# Patient Record
Sex: Male | Born: 1960 | Race: White | Hispanic: No | Marital: Single | Smoking: Current every day smoker
Health system: Southern US, Community
[De-identification: ages and names within clinical notes are randomized; demographics above are authoritative.]

## PROBLEM LIST (undated history)

## (undated) DIAGNOSIS — K5792 Diverticulitis of intestine, part unspecified, without perforation or abscess without bleeding: Secondary | ICD-10-CM

## (undated) DIAGNOSIS — E785 Hyperlipidemia, unspecified: Secondary | ICD-10-CM

## (undated) DIAGNOSIS — I10 Essential (primary) hypertension: Secondary | ICD-10-CM

## (undated) DIAGNOSIS — K922 Gastrointestinal hemorrhage, unspecified: Secondary | ICD-10-CM

## (undated) HISTORY — PX: APPENDECTOMY: SHX54

## (undated) HISTORY — PX: TONSILLECTOMY: SUR1361

## (undated) HISTORY — DX: Gastrointestinal hemorrhage, unspecified: K92.2

## (undated) HISTORY — PX: COLON SURGERY: SHX602

---

## 2016-02-11 LAB — GLUCOSE, POCT (MANUAL RESULT ENTRY): POC Glucose: 109 mg/dl — AB (ref 70–99)

## 2016-02-12 DIAGNOSIS — Z79899 Other long term (current) drug therapy: Secondary | ICD-10-CM

## 2016-02-12 NOTE — Congregational Nurse Program (Signed)
Congregational Nurse Program Note  Date of Encounter: 02/12/2016  Past Medical History: No past medical history on file.  Encounter Details:     CNP Questionnaire - 02/12/16 1121      Patient Demographics   Is this a new or existing patient? New   Patient is considered a/an Not Applicable   Race Caucasian/White     Patient Assistance   Location of Patient Assistance Not Applicable   Patient's financial/insurance status Low Income   Uninsured Patient Yes   Interventions Appt. has been completed;Assisted patient in making appt.   Patient referred to apply for the following financial assistance [employer]   Food insecurities addressed Not Technical brewerApplicable   Transportation assistance No   Assistance securing medications Yes   Type of Designer, television/film setAssistance Other   Educational health offerings Behavioral health;Hypertension;Chronic disease;Medications;Navigating the healthcare system     Encounter Details   Primary purpose of visit Chronic Illness/Condition Visit;Navigating the Healthcare System   Was an Emergency Department visit averted? Yes   Does patient have a medical provider? No   Patient referred to Clinic   Was a mental health screening completed? (GAINS tool) No   Does patient have dental issues? No   Does your patient have an abnormal blood pressure today? Yes   Since previous encounter, have you referred patient for abnormal blood pressure that resulted in a new diagnosis or medication change? No   Does your patient have an abnormal blood glucose today? No   Since previous encounter, have you referred patient for abnormal blood glucose that resulted in a new diagnosis or medication change? No   Was there a life-saving intervention made? No     [facility] last night with a B/P of 180/104.  [facility] follow up with Kyle AbrahamsMary Ann Placey Olsen.  Client does not have a HCP, nor resources for medications.  Has been prescribed  anti-hypertensives but cannot afford them.  Appointment made with Ms. Kyle Olsen and visit completed.

## 2016-06-15 ENCOUNTER — Emergency Department
Admission: EM | Admit: 2016-06-15 | Discharge: 2016-06-15 | Disposition: A | Discharge status: Home / Self Care | Payer: Self-pay | Attending: Emergency Medicine | Admitting: Emergency Medicine

## 2016-06-15 ENCOUNTER — Encounter: Payer: Self-pay | Admitting: Emergency Medicine

## 2016-06-15 DIAGNOSIS — Z79899 Other long term (current) drug therapy: Secondary | ICD-10-CM | POA: Insufficient documentation

## 2016-06-15 DIAGNOSIS — F17210 Nicotine dependence, cigarettes, uncomplicated: Secondary | ICD-10-CM | POA: Insufficient documentation

## 2016-06-15 DIAGNOSIS — I10 Essential (primary) hypertension: Secondary | ICD-10-CM | POA: Insufficient documentation

## 2016-06-15 DIAGNOSIS — M5441 Lumbago with sciatica, right side: Secondary | ICD-10-CM | POA: Insufficient documentation

## 2016-06-15 HISTORY — DX: Essential (primary) hypertension: I10

## 2016-06-15 HISTORY — DX: Diverticulitis of intestine, part unspecified, without perforation or abscess without bleeding: K57.92

## 2016-06-15 MED ORDER — KETOROLAC TROMETHAMINE 60 MG/2ML IM SOLN
30.0000 mg | Freq: Once | INTRAMUSCULAR | Status: AC
  Administered 2016-06-15: 30 mg via INTRAMUSCULAR
  Filled 2016-06-15: qty 2

## 2016-06-15 MED ORDER — KETOROLAC TROMETHAMINE 10 MG PO TABS: 10.0000 mg | ORAL_TABLET | Freq: Three times a day (TID) | ORAL | 0 refills | Status: DC

## 2016-06-15 MED ORDER — ORPHENADRINE CITRATE 30 MG/ML IJ SOLN
60.0000 mg | INTRAMUSCULAR | Status: AC
  Administered 2016-06-15: 60 mg via INTRAMUSCULAR
  Filled 2016-06-15: qty 2

## 2016-06-15 MED ORDER — CYCLOBENZAPRINE HCL 5 MG PO TABS: 5.0000 mg | ORAL_TABLET | Freq: Three times a day (TID) | ORAL | 0 refills | Status: DC | PRN

## 2016-06-15 NOTE — ED Notes (Signed)
See triage note. States he twisted wrong this weekend   Developed pain to right hip/back which radiates into right leg   Ambulates with slight limp d/t pain

## 2016-06-15 NOTE — ED Triage Notes (Signed)
R hip pain x 2 days, began while working on roof.

## 2016-06-15 NOTE — Discharge Instructions (Signed)
You appear to have some back pain with mild sciatic nerve irritation. Take the prescription meds as directed. Apply moist heat and ice to reduce pain and swelling. Follow-up with your primary care provider or return to the ED for worsening symptoms.
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
## 2016-07-04 ENCOUNTER — Emergency Department: Payer: Self-pay

## 2016-07-04 ENCOUNTER — Encounter: Payer: Self-pay | Admitting: Emergency Medicine

## 2016-07-04 ENCOUNTER — Emergency Department
Admission: EM | Admit: 2016-07-04 | Discharge: 2016-07-04 | Disposition: A | Discharge status: Home / Self Care | Payer: Self-pay | Attending: Emergency Medicine | Admitting: Emergency Medicine

## 2016-07-04 DIAGNOSIS — F17210 Nicotine dependence, cigarettes, uncomplicated: Secondary | ICD-10-CM | POA: Insufficient documentation

## 2016-07-04 DIAGNOSIS — Z791 Long term (current) use of non-steroidal anti-inflammatories (NSAID): Secondary | ICD-10-CM | POA: Insufficient documentation

## 2016-07-04 DIAGNOSIS — Z79899 Other long term (current) drug therapy: Secondary | ICD-10-CM | POA: Insufficient documentation

## 2016-07-04 DIAGNOSIS — I160 Hypertensive urgency: Secondary | ICD-10-CM | POA: Insufficient documentation

## 2016-07-04 LAB — CBC
HEMATOCRIT: 48.9 % (ref 40.0–52.0)
HEMOGLOBIN: 16.6 g/dL (ref 13.0–18.0)
MCH: 29 pg (ref 26.0–34.0)
MCHC: 34 g/dL (ref 32.0–36.0)
MCV: 85.4 fL (ref 80.0–100.0)
Platelets: 227 10*3/uL (ref 150–440)
RBC: 5.73 MIL/uL (ref 4.40–5.90)
RDW: 14.8 % — ABNORMAL HIGH (ref 11.5–14.5)
WBC: 9.3 10*3/uL (ref 3.8–10.6)

## 2016-07-04 LAB — BASIC METABOLIC PANEL
ANION GAP: 7 (ref 5–15)
BUN: 18 mg/dL (ref 6–20)
CHLORIDE: 102 mmol/L (ref 101–111)
CO2: 27 mmol/L (ref 22–32)
Calcium: 9.5 mg/dL (ref 8.9–10.3)
Creatinine, Ser: 1.02 mg/dL (ref 0.61–1.24)
GFR calc Af Amer: >60 mL/min (ref >60)
GLUCOSE: 90 mg/dL (ref 65–99)
POTASSIUM: 4.4 mmol/L (ref 3.5–5.1)
Sodium: 136 mmol/L (ref 135–145)

## 2016-07-04 LAB — TROPONIN I: Troponin I: <0.03 ng/mL (ref <0.03)

## 2016-07-04 MED ORDER — CLONIDINE HCL 0.2 MG PO TABS: 0.2000 mg | ORAL_TABLET | Freq: Three times a day (TID) | ORAL | 0 refills | Status: DC | PRN

## 2016-07-04 MED ORDER — CLONIDINE HCL 0.1 MG PO TABS
0.2000 mg | ORAL_TABLET | Freq: Once | ORAL | Status: AC
  Administered 2016-07-04: 0.2 mg via ORAL
  Filled 2016-07-04: qty 2

## 2016-07-04 NOTE — ED Provider Notes (Signed)
Time Seen: Approximately 1828  I have reviewed the triage notes  Chief Complaint: Hypertension and Headache   History of Present Illness: Kyle Olsen is a 56 y.o. male who states he has a history of hypertension and has been taking his Lopressor 50 mg twice a day. He's been following his blood pressure over the recent couple of days due to some intermittent headaches and points mainly to the occipital and frontal region. He denies any neck pain, photophobia, nausea or vomiting. He shows me a "a piece of paper that shows blood pressure is mainly ranging from 100 diastolic to 115 diastolic and 161-096 systolic. He states his headaches usually resolve with ibuprofen. He states he contacted his primary caretaker which is a Publishing rights manager and they have called in some additional blood pressure medication for him but he has not had a chance to go get it filled and can't do it until Monday. States some left-sided chest "" tightness"" and some mild pleuritic shortness of breath that lasted approximately 2 hours last night. Denies any associated nausea or vomiting with this particular pain and denies any neck or arm or jaw discomfort. He denies any chest discomfort at present states he only noticed it when he tried to lie down to go to sleep. Outside of smoking as no pulmonary emboli risk factors.   Past Medical History:  Diagnosis Date  . Diverticulitis   . Hypertension     There are no active problems to display for this patient.   Past Surgical History:  Procedure Laterality Date  . APPENDECTOMY    . TONSILLECTOMY      Past Surgical History:  Procedure Laterality Date  . APPENDECTOMY    . TONSILLECTOMY      Current Outpatient Rx  . Order #: [id number] Class: Print  . Order #: [id number] Class: Historical Med  . Order #: [id number] Class: Historical Med  . Order #: [id number] Class: Print  . Order #: [id number] Class: Historical Med    Allergies:  Patient has no known  allergies.  Family History: History reviewed. No pertinent family history.  Social History: Social History  Substance Use Topics  . Smoking status: Current Every Day Smoker    Packs/day: 1.00    Types: Cigarettes  . Smokeless tobacco: Never Used  . Alcohol use No     Review of Systems:   10 point review of systems was performed and was otherwise negative:  Constitutional: No fever Eyes: No visual disturbances ENT: No sore throat, ear pain Cardiac: No chest pain Respiratory: No shortness of breath, wheezing, or stridor Abdomen: No abdominal pain, no vomiting, No diarrhea Endocrine: No weight loss, No night sweats Extremities: No peripheral edema, cyanosis Skin: No rashes, easy bruising Neurologic: No focal weakness, trouble with speech or swollowing Urologic: No dysuria, Hematuria, or urinary frequency   Physical Exam:  ED Triage Vitals [07/04/16 1717]  Enc Vitals Group     BP (!) 191/105     Pulse Rate 68     Resp 20     Temp 97.6 F (36.4 C)     Temp Source Oral     SpO2 99 %     Weight 155 lb (70.3 kg)     Height 5\' 7"  (1.702 m)     Head Circumference      Peak Flow      Pain Score 7     Pain Loc      Pain Edu?      Excl. in GC?  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 Left pupil is reactive to light with no papilledema and normal conjunctiva  Nose/Throat: No nasal drainage, patent upper airway without erythema or exudate.  Neck: Supple, Full range of motion, No anterior adenopathy or palpable thyroid masses. No meningeal signs Lungs: Clear to ascultation without wheezes , rhonchi, or rales Heart: Regular rate, regular rhythm without murmurs , gallops , or rubs Abdomen: Soft, non tender without rebound, guarding , or rigidity; bowel sounds positive and symmetric in all 4 quadrants. No organomegaly .        Extremities: 2  plus symmetric pulses. No edema, clubbing or cyanosis Neurologic: normal ambulation, Motor symmetric without deficits, sensory intact Skin: warm, dry, no rashes   Labs:   All laboratory work was reviewed including any pertinent negatives or positives listed below:  Labs Reviewed  CBC - Abnormal; Notable for the following:       Result Value   RDW 14.8 (*)    All other components within normal limits  BASIC METABOLIC PANEL  TROPONIN I    EKG: * ED ECG REPORT I, Jennye MoccasinBrian S Tarl Cephas, the attending physician, personally viewed and interpreted this ECG.  Date: 07/04/2016 EKG Time: 1712 Rate: 62 Rhythm: normal sinus rhythm QRS Axis: normal Intervals: normal ST/T Wave abnormalities: normal Conduction Disturbances: none Narrative Interpretation: unremarkable Normal EKG   Radiology: "Dg Chest 2 View  Result Date: 07/04/2016 CLINICAL DATA:  Pt states he started having headaches yesterday and checking blood pressure at home. Pt states he has noticed an increase in shortness of breath and left side chest pain to left shoulder. Pt took ibuprofen 800mg  around 4pm. Smoker. Hx of HTN EXAM: CHEST  2 VIEW COMPARISON:  None. FINDINGS: The cardiac silhouette is normal in size and configuration. No mediastinal or hilar masses or evidence of adenopathy. There is linear opacity at the lung bases consistent with subsegmental atelectasis. The lungs are otherwise clear. No pleural effusion or pneumothorax. Skeletal structures are intact. IMPRESSION: No active cardiopulmonary disease. Electronically Signed   By: Amie Portlandavid  Ormond M.D.   On: 07/04/2016 18:07   Ct Head Wo Contrast  Result Date: 07/04/2016 CLINICAL DATA:  Headache and htn x 2 days EXAM: CT HEAD WITHOUT CONTRAST TECHNIQUE: Contiguous axial images were obtained from the base of the skull through the vertex without intravenous contrast. COMPARISON:  None. FINDINGS: Brain: No evidence of acute infarction, hemorrhage, hydrocephalus, extra-axial  collection or mass lesion/mass effect. Vascular: No hyperdense vessel or unexpected calcification. Skull: Normal. Negative for fracture or focal lesion. Sinuses/Orbits: Visualize globes and orbits are unremarkable. Visualized sinuses are clear. Clear mastoid air cells. Other: None. IMPRESSION: No acute intracranial abnormalities. Electronically Signed   By: Amie Portlandavid  Ormond M.D.   On: 07/04/2016 20:31  "  I personally reviewed the radiologic studies    ED Course:  Patient's stay here was uneventful and the patient had a decrease in his blood pressure with the clonidine and feels improved at this point. I felt his headaches were unlikely to be a subarachnoid hemorrhage or aneurysmal disease. His chest pain which occurred over 2 hour span last night does not reflect any EKG changes or abnormalities and his troponin and did not seem to be obvious acute coronary syndrome at this time. He was advised that he should follow up with his primary physician for both complaints is high blood  pressure and also the left-sided chest discomfort. Clonidine for tomorrow and states he has a prescription waiting for him for additional blood pressure medication to start Monday. He was advised to return here if he has a repeat episode of chest pain or any other new concerns.     Assessment: * Hypertensive urgency      Plan:  Outpatient Aspirin per day " New Prescriptions   CLONIDINE (CATAPRES) 0.2 MG TABLET    Take 1 tablet (0.2 mg total) by mouth 3 (three) times daily as needed.  " Patient was advised to return immediately if condition worsens. Patient was advised to follow up with their primary care physician or other specialized physicians involved in their outpatient care. The patient and/or family member/power of attorney had laboratory results reviewed at the bedside. All questions and concerns were addressed and appropriate discharge instructions were distributed by the nursing staff.             Jennye Moccasin, MD 07/04/16 2111

## 2016-07-04 NOTE — Discharge Instructions (Signed)
Please continue with over-the-counter pain medications. Return to emergency Department for persistent chest discomfort, vomiting, sweating, or any other new concerns. Please continue with your Lopressor twice a day and initiate her blood pressure medication on Monday after receiving a from the pharmacy. He can use the clonidine and take it 3 times a day for tomorrow along with your continued Lopressor for blood pressure control.  Please return immediately if condition worsens. Please contact her primary physician or the physician you were given for referral. If you have any specialist physicians involved in her treatment and plan please also contact them. Thank you for using [facility].

## 2016-07-04 NOTE — ED Triage Notes (Signed)
Pt states he started having headaches yesterday and checking blood pressure at home. Pt states he has noticed an increase in shortness of breath and left side chest pain to left shoulder.  Pt took ibuprofen 800mg  around 4pm.

## 2016-11-23 ENCOUNTER — Emergency Department: Payer: Self-pay

## 2016-11-23 ENCOUNTER — Encounter: Payer: Self-pay | Admitting: Emergency Medicine

## 2016-11-23 ENCOUNTER — Emergency Department
Admission: EM | Admit: 2016-11-23 | Discharge: 2016-11-23 | Disposition: A | Discharge status: Home / Self Care | Payer: Self-pay | Attending: Emergency Medicine | Admitting: Emergency Medicine

## 2016-11-23 DIAGNOSIS — R51 Headache: Secondary | ICD-10-CM

## 2016-11-23 DIAGNOSIS — Z79899 Other long term (current) drug therapy: Secondary | ICD-10-CM | POA: Insufficient documentation

## 2016-11-23 DIAGNOSIS — R0602 Shortness of breath: Secondary | ICD-10-CM | POA: Insufficient documentation

## 2016-11-23 DIAGNOSIS — I10 Essential (primary) hypertension: Secondary | ICD-10-CM | POA: Insufficient documentation

## 2016-11-23 DIAGNOSIS — R519 Headache, unspecified: Secondary | ICD-10-CM

## 2016-11-23 DIAGNOSIS — I160 Hypertensive urgency: Secondary | ICD-10-CM | POA: Insufficient documentation

## 2016-11-23 DIAGNOSIS — F17210 Nicotine dependence, cigarettes, uncomplicated: Secondary | ICD-10-CM | POA: Insufficient documentation

## 2016-11-23 DIAGNOSIS — R202 Paresthesia of skin: Secondary | ICD-10-CM | POA: Insufficient documentation

## 2016-11-23 DIAGNOSIS — R079 Chest pain, unspecified: Secondary | ICD-10-CM | POA: Insufficient documentation

## 2016-11-23 LAB — BASIC METABOLIC PANEL
ANION GAP: 10 (ref 5–15)
BUN: 13 mg/dL (ref 6–20)
CO2: 27 mmol/L (ref 22–32)
Calcium: 9.9 mg/dL (ref 8.9–10.3)
Chloride: 102 mmol/L (ref 101–111)
Creatinine, Ser: 0.98 mg/dL (ref 0.61–1.24)
GFR calc Af Amer: >60 mL/min (ref >60)
GLUCOSE: 109 mg/dL — AB (ref 65–99)
POTASSIUM: 4.4 mmol/L (ref 3.5–5.1)
Sodium: 139 mmol/L (ref 135–145)

## 2016-11-23 LAB — TROPONIN I: Troponin I: <0.03 ng/mL (ref <0.03)

## 2016-11-23 LAB — CBC
HEMATOCRIT: 51 % (ref 40.0–52.0)
HEMOGLOBIN: 17.6 g/dL (ref 13.0–18.0)
MCH: 29.5 pg (ref 26.0–34.0)
MCHC: 34.5 g/dL (ref 32.0–36.0)
MCV: 85.5 fL (ref 80.0–100.0)
Platelets: 234 10*3/uL (ref 150–440)
RBC: 5.97 MIL/uL — ABNORMAL HIGH (ref 4.40–5.90)
RDW: 14.7 % — ABNORMAL HIGH (ref 11.5–14.5)
WBC: 8.4 10*3/uL (ref 3.8–10.6)

## 2016-11-23 MED ORDER — ONDANSETRON HCL 4 MG/2ML IJ SOLN
4.0000 mg | Freq: Once | INTRAMUSCULAR | Status: AC
  Administered 2016-11-23: 4 mg via INTRAVENOUS
  Filled 2016-11-23: qty 2

## 2016-11-23 MED ORDER — ASPIRIN 81 MG PO CHEW
324.0000 mg | CHEWABLE_TABLET | Freq: Once | ORAL | Status: AC
  Administered 2016-11-23: 324 mg via ORAL
  Filled 2016-11-23: qty 4

## 2016-11-23 MED ORDER — IOPAMIDOL (ISOVUE-370) INJECTION 76%
75.0000 mL | Freq: Once | INTRAVENOUS | Status: AC | PRN
  Administered 2016-11-23: 75 mL via INTRAVENOUS

## 2016-11-23 MED ORDER — NITROGLYCERIN 0.4 MG SL SUBL
0.4000 mg | SUBLINGUAL_TABLET | SUBLINGUAL | Status: DC | PRN
  Administered 2016-11-23: 0.4 mg via SUBLINGUAL
  Filled 2016-11-23: qty 1

## 2016-11-23 MED ORDER — MORPHINE SULFATE (PF) 4 MG/ML IV SOLN
4.0000 mg | Freq: Once | INTRAVENOUS | Status: AC
  Administered 2016-11-23: 4 mg via INTRAVENOUS
  Filled 2016-11-23: qty 1

## 2016-11-23 NOTE — ED Notes (Signed)
AAOx3.  Skin warm and dry.  Return from CT scan.

## 2016-11-23 NOTE — ED Notes (Signed)
AAOx3.  Skin warm and dry.  Ambulates with easy and steady gait. NAD 

## 2016-11-23 NOTE — ED Provider Notes (Signed)
[facility] Emergency Department Provider Note  ____________________________________________  Time seen: Approximately 9:03 AM  I have reviewed the triage vital signs and the nursing notes.   HISTORY  Chief Complaint Hypertension and Shortness of Breath   HPI Kyle Olsen is a 56 y.o. male with a history of hypertension who presents for evaluation of a headache and chest pain.Patient reports that he woke up this morning with a severe headache that is located in the right side of his head, sharp, nonradiating and constant. Symptoms started around 4:30 AM. Just before to arrival to the emergency room patient started having heaviness in his chest that he describes as mild to moderate and numbness in his left arm. Patient denies personal or family history of heart attacks. He took his antihypertensives this morning including clonidine and metoprolol. Patient denies ever having similar headache or chest pain. He denies changes in vision, nausea or vomiting, slurred speech, facial droop, unilateral weakness or numbness. Patient is a smoker.  Past Medical History:  Diagnosis Date  . Diverticulitis   . Hypertension     There are no active problems to display for this patient.   Past Surgical History:  Procedure Laterality Date  . APPENDECTOMY    . TONSILLECTOMY      Prior to Admission medications   Medication Sig Start Date End Date Taking? Authorizing Provider  cloNIDine (CATAPRES) 0.2 MG tablet Take 1 tablet (0.2 mg total) by mouth 3 (three) times daily as needed. Patient taking differently: Take 0.1 mg by mouth 2 (two) times daily.  07/04/16 11/23/16 Yes Jennye MoccasinQuigley, Brian S, MD  cyclobenzaprine (FLEXERIL) 5 MG tablet Take 1 tablet (5 mg total) by mouth 3 (three) times daily as needed for muscle spasms. Patient taking differently: Take 10 mg by mouth daily.  06/15/16  Yes Menshew, Charlesetta IvoryJenise V Bacon, PA-C  gabapentin (NEURONTIN) 300 MG capsule Take 300 mg by mouth 2  (two) times daily.    Yes [provider]  metoprolol (LOPRESSOR) 50 MG tablet Take 50 mg by mouth 2 (two) times daily.   Yes [provider]  ketorolac (TORADOL) 10 MG tablet Take 1 tablet (10 mg total) by mouth every 8 (eight) hours. Patient not taking: Reported on 11/23/2016 06/15/16   Menshew, Charlesetta IvoryJenise V Bacon, PA-C    Allergies Patient has no known allergies.  Family History Mother: no lung cancer, stroke, or heart attack Father: no lung cancer, stroke, or heart attack Siblings: no lung cancer, stroke, or heart attack  Social History Social History  Substance Use Topics  . Smoking status: Current Every Day Smoker    Packs/day: 1.00    Types: Cigarettes  . Smokeless tobacco: Never Used  . Alcohol use No    Review of Systems  Constitutional: Negative for fever. Eyes: Negative for visual changes. ENT: Negative for sore throat. Neck: No neck pain  Cardiovascular: + chest pain. Respiratory: Negative for shortness of breath. Gastrointestinal: Negative for abdominal pain, vomiting or diarrhea. Genitourinary: Negative for dysuria. Musculoskeletal: Negative for back pain. Skin: Negative for rash. Neurological: Negative for weakness or numbness. + HA Psych: No SI or HI  ____________________________________________   PHYSICAL EXAM:  VITAL SIGNS: ED Triage Vitals  Enc Vitals Group     BP 11/23/16 0736 (!) 181/100     Pulse Rate 11/23/16 0736 72     Resp 11/23/16 0736 18     Temp 11/23/16 0736 98 F (36.7 C)     Temp Source 11/23/16 0736 Oral  SpO2 11/23/16 0736 100 %     Weight 11/23/16 0734 170 lb (77.1 kg)     Height 11/23/16 0734 5\' 7"  (1.702 m)     Head Circumference --      Peak Flow --      Pain Score 11/23/16 0733 10     Pain Loc --      Pain Edu? --      Excl. in GC? --     Constitutional: Alert and oriented, patient is distressed due to pain.  HEENT:      Head: Normocephalic and atraumatic.         Eyes: Conjunctivae are normal.  Sclera is non-icteric. Pupils are equal round and reactive, patient has disconjugate gaze which is chronic      Mouth/Throat: Mucous membranes are moist.       Neck: Supple with no signs of meningismus. Cardiovascular: Regular rate and rhythm. No murmurs, gallops, or rubs. 2+ symmetrical distal pulses are present in all extremities. No JVD. Respiratory: Normal respiratory effort. Lungs are clear to auscultation bilaterally. No wheezes, crackles, or rhonchi.  Gastrointestinal: Soft, non tender, and non distended with positive bowel sounds. No rebound or guarding. Musculoskeletal: Nontender with normal range of motion in all extremities. No edema, cyanosis, or erythema of extremities. Neurologic: Normal speech and language. A & O x3, PERRL, no nystagmus, CN II-XII intact, motor testing reveals good tone and bulk throughout. There is no evidence of pronator drift or dysmetria. Muscle strength is 5/5 throughout. Sensory examination is intact. Gait is normal. Skin: Skin is warm, dry and intact. No rash noted. Psychiatric: Mood and affect are normal. Speech and behavior are normal.  ____________________________________________   LABS (all labs ordered are listed, but only abnormal results are displayed)  Labs Reviewed  BASIC METABOLIC PANEL - Abnormal; Notable for the following:       Result Value   Glucose, Bld 109 (*)    All other components within normal limits  CBC - Abnormal; Notable for the following:    RBC 5.97 (*)    RDW 14.7 (*)    All other components within normal limits  TROPONIN I  TROPONIN I   ____________________________________________  EKG  ED ECG REPORT I, Nita Sickle, the attending physician, personally viewed and interpreted this ECG.  07:34 - Normal sinus rhythm, rate of 79, normal intervals, normal axis, no ST elevations or depressions.   09:22 - Normal sinus rhythm,Rate of 68, normal intervals, right axis deviation, T-wave inversions in 3 and aVF, no ST  elevation.  11:29 - normal sinus rhythm, rate of 56, normal intervals, right axis deviation, T-wave inversions in 3 and aVF, no ST elevation. ____________________________________________  RADIOLOGY  CXR: 1. No acute cardiopulmonary abnormality. 2. Stable bibasilar scarring or atelectasis. Possible upper lobe emphysema and scarring     CT head: Negative noncontrast head CT.    CT angio chest: 1. No thoracic aortic aneurysm or dissection. Areas of atherosclerotic calcification noted in the aorta and left subclavian artery. Scattered foci of coronary artery calcification noted. No evident pulmonary embolus.  2. Bullous disease in right upper lobe. Mild bibasilar lung scarring. No edema or consolidation.  3. Nodular lesions in right upper lobe and right middle lobe, largest measuring 5 mm. No follow-up needed if patient is low-risk (and has no known or suspected primary neoplasm). Non-contrast chest CT can be considered in 12 months if patient is high-risk. This recommendation follows the consensus statement: Guidelines for Management of Incidental Pulmonary  Nodules Detected on CT Images: From the Fleischner Society 2017; Radiology 2017; [id number].  4. No demonstrable adenopathy.  5. Cholelithiasis.  6. Incomplete visualization 3 mm calculus upper pole right kidney. ____________________________________________   PROCEDURES  Procedure(s) performed: None Procedures Critical Care performed:  None ____________________________________________   INITIAL IMPRESSION / ASSESSMENT AND PLAN / ED COURSE  56 y.o. male with a history of hypertension who presents for evaluation of a headache and chest pain.Patient's symptoms distressed due to his headache, patient is hypertensive with BP of 181/100, he is neurologically intact. Patient was sent stat head CT which was negative for bleed. Patient received 4 mg of IV morphine with full resolution of his headache. Remains well  appearing and neurologically intact. Patient's initial EKG with no ischemic changes. Patient will be given aspirin and nitroglycerin. We'll get a repeat EKG. We'll cycle cardiac enzymes. We'll monitor patient on telemetry for any signs of ischemia.    ----------------------------------------- 07:54 AM on 11/23/2016 -----------------------------------------   OBSERVATION CARE: This patient is being placed under observation care for the following reasons: Chest pain with repeat testing to rule out ischemia  ----------------------------------------- 09:09 AM on 11/23/2016 ----------------------------------------- Patient's HA resolved. CT head negative for bleed done within less than 6 hours of onset of HA. Patient remains neurologically intact with no concerns for stroke. Patient continues to complain of tightness in his chest radiating to his left arm. Repeat EKG showed new TWI in lead 3 but no STE. Patient received one sublingual nitroglycerin with resolution of the pain. Patient was given a full dose of aspirin. First troponin is negative. We'll continue to monitor her second troponin. CTA ordered to rule out dissection.  _________________________ ----------------------------------------- 12:08 PM on 11/23/2016 -----------------------------------------   END OF OBSERVATION STATUS: After an appropriate period of observation, this patient is being discharged due to the following reason(s):     Second troponin negative. Third EKG while pain free showing persistent TWI in 3 with no STE. Patient remains pain free. Will dc home with f/u with cardiology. Discussed with the patient the findings of 2 small nodules in his right lung and recommended follow-up with his primary care doctor in 12 months for repeat CT scan. Patient will make sure that he gets this follow-up. Also recommend he return to the emergency room if he develops new onset of chest pain or severe headache.     Pertinent labs &  imaging results that were available during my care of the patient were reviewed by me and considered in my medical decision making (see chart for details).    ____________________________________________   FINAL CLINICAL IMPRESSION(S) / ED DIAGNOSES  Final diagnoses:  Hypertensive urgency  Acute nonintractable headache, unspecified headache type  Chest pain, unspecified type      NEW MEDICATIONS STARTED DURING THIS VISIT:  New Prescriptions   No medications on file     Note:  This document was prepared using Dragon voice recognition software and may include unintentional dictation errors.    Nita Sickle, MD 11/23/16 [phone]

## 2016-11-23 NOTE — ED Notes (Signed)
To CT Scan via stretcher.

## 2016-11-23 NOTE — ED Triage Notes (Signed)
Pt reports elevated blood pressure, left arm numbness and severe headache. Pt reports symptoms began at approximately 0430 today.

## 2016-11-23 NOTE — Discharge Instructions (Signed)
You were seen for chest pain. Your workup today was reassuring. As I explained to you that does not mean that you do not have heart disease. You may need further evaluation to ensure you do not have a serious heart problem. Therefore it is imperative that you follow up with cardiology in 1-2 days for further evaluation.   Also, as I explained to you your CT showed two nodules in your right lung. You must repeat CT scan in 12 months to make sure these are stable and not concerning for early lung cancer. Your doctor can order the CT for you.  When should you call for help?  Call 911 if: You passed out (lost consciousness) or if you feel dizzy. You have difficulty breathing. You have symptoms of a heart attack. These may include: Chest pain or pressure, or a strange feeling in your chest. Indigestion. Sweating. Shortness of breath. Nausea or vomiting. Pain, pressure, or a strange feeling in your back, neck, jaw, or upper belly or in one or both shoulders or arms. Lightheadedness or sudden weakness. A fast or irregular heartbeat. After you call 911, the operator may tell you to chew 1 adult-strength or 2 to 4 low-dose aspirin. Wait for an ambulance. Do not try to drive yourself.   Call your doctor today if: You have any trouble breathing. Your chest pain gets worse. You are dizzy or lightheaded, or you feel like you may faint. You are not getting better as expected. You are having new or different chest pain  How can you care for yourself at home? Rest until you feel better. Take your medicine exactly as prescribed. Call your doctor if you think you are having a problem with your medicine. Do not drive after taking a prescription pain medicine.

## 2016-11-25 ENCOUNTER — Observation Stay
Admission: EM | Admit: 2016-11-25 | Discharge: 2016-11-26 | Disposition: A | Discharge status: Home / Self Care | Payer: Self-pay | Attending: Internal Medicine | Admitting: Internal Medicine

## 2016-11-25 ENCOUNTER — Encounter: Payer: Self-pay | Admitting: Internal Medicine

## 2016-11-25 ENCOUNTER — Emergency Department: Payer: Self-pay

## 2016-11-25 ENCOUNTER — Ambulatory Visit (INDEPENDENT_AMBULATORY_CARE_PROVIDER_SITE_OTHER): Payer: Self-pay | Admitting: Internal Medicine

## 2016-11-25 ENCOUNTER — Encounter: Payer: Self-pay | Admitting: Emergency Medicine

## 2016-11-25 ENCOUNTER — Ambulatory Visit: Payer: Self-pay | Admitting: Internal Medicine

## 2016-11-25 VITALS — BP 180/114 | HR 94 | Ht 67.0 in | Wt 175.0 lb

## 2016-11-25 DIAGNOSIS — Z7982 Long term (current) use of aspirin: Secondary | ICD-10-CM | POA: Insufficient documentation

## 2016-11-25 DIAGNOSIS — R079 Chest pain, unspecified: Secondary | ICD-10-CM | POA: Diagnosis present

## 2016-11-25 DIAGNOSIS — E785 Hyperlipidemia, unspecified: Secondary | ICD-10-CM | POA: Diagnosis present

## 2016-11-25 DIAGNOSIS — I700 Atherosclerosis of aorta: Secondary | ICD-10-CM | POA: Diagnosis present

## 2016-11-25 DIAGNOSIS — I200 Unstable angina: Secondary | ICD-10-CM

## 2016-11-25 DIAGNOSIS — I25110 Atherosclerotic heart disease of native coronary artery with unstable angina pectoris: Secondary | ICD-10-CM | POA: Insufficient documentation

## 2016-11-25 DIAGNOSIS — Z720 Tobacco use: Secondary | ICD-10-CM | POA: Insufficient documentation

## 2016-11-25 DIAGNOSIS — I161 Hypertensive emergency: Principal | ICD-10-CM | POA: Diagnosis present

## 2016-11-25 DIAGNOSIS — Z79899 Other long term (current) drug therapy: Secondary | ICD-10-CM | POA: Insufficient documentation

## 2016-11-25 DIAGNOSIS — F17210 Nicotine dependence, cigarettes, uncomplicated: Secondary | ICD-10-CM | POA: Insufficient documentation

## 2016-11-25 HISTORY — DX: Hyperlipidemia, unspecified: E78.5

## 2016-11-25 LAB — BASIC METABOLIC PANEL
Anion gap: 8 (ref 5–15)
BUN: 13 mg/dL (ref 6–20)
CALCIUM: 9.2 mg/dL (ref 8.9–10.3)
CO2: 25 mmol/L (ref 22–32)
Chloride: 106 mmol/L (ref 101–111)
Creatinine, Ser: 0.95 mg/dL (ref 0.61–1.24)
GFR calc Af Amer: >60 mL/min (ref >60)
GLUCOSE: 109 mg/dL — AB (ref 65–99)
Potassium: 3.6 mmol/L (ref 3.5–5.1)
Sodium: 139 mmol/L (ref 135–145)

## 2016-11-25 LAB — URINALYSIS, COMPLETE (UACMP) WITH MICROSCOPIC
BACTERIA UA: NONE SEEN
BILIRUBIN URINE: NEGATIVE
Glucose, UA: NEGATIVE mg/dL
Ketones, ur: NEGATIVE mg/dL
LEUKOCYTES UA: NEGATIVE
NITRITE: NEGATIVE
PH: 5 (ref 5.0–8.0)
Protein, ur: NEGATIVE mg/dL
SPECIFIC GRAVITY, URINE: 1.019 (ref 1.005–1.030)

## 2016-11-25 LAB — CBC WITH DIFFERENTIAL/PLATELET
Basophils Absolute: 0.1 10*3/uL (ref 0–0.1)
Basophils Relative: 1 %
EOS PCT: 3 %
Eosinophils Absolute: 0.3 10*3/uL (ref 0–0.7)
HCT: 48.5 % (ref 40.0–52.0)
Hemoglobin: 16.9 g/dL (ref 13.0–18.0)
LYMPHS ABS: 2.2 10*3/uL (ref 1.0–3.6)
LYMPHS PCT: 25 %
MCH: 29.5 pg (ref 26.0–34.0)
MCHC: 34.9 g/dL (ref 32.0–36.0)
MCV: 84.7 fL (ref 80.0–100.0)
MONO ABS: 0.6 10*3/uL (ref 0.2–1.0)
Monocytes Relative: 7 %
Neutro Abs: 5.9 10*3/uL (ref 1.4–6.5)
Neutrophils Relative %: 64 %
PLATELETS: 230 10*3/uL (ref 150–440)
RBC: 5.73 MIL/uL (ref 4.40–5.90)
RDW: 14.1 % (ref 11.5–14.5)
WBC: 9.1 10*3/uL (ref 3.8–10.6)

## 2016-11-25 LAB — BRAIN NATRIURETIC PEPTIDE: B NATRIURETIC PEPTIDE 5: 19 pg/mL (ref 0.0–100.0)

## 2016-11-25 LAB — HEPATIC FUNCTION PANEL
ALT: 25 U/L (ref 17–63)
AST: 26 U/L (ref 15–41)
Albumin: 4.2 g/dL (ref 3.5–5.0)
Alkaline Phosphatase: 53 U/L (ref 38–126)
BILIRUBIN DIRECT: 0.1 mg/dL (ref 0.1–0.5)
BILIRUBIN INDIRECT: 0.9 mg/dL (ref 0.3–0.9)
Total Bilirubin: 1 mg/dL (ref 0.3–1.2)
Total Protein: 7.6 g/dL (ref 6.5–8.1)

## 2016-11-25 LAB — LIPID PANEL
Cholesterol: 232 mg/dL — ABNORMAL HIGH (ref 0–200)
HDL: 34 mg/dL — AB (ref >40)
LDL CALC: 162 mg/dL — AB (ref 0–99)
Total CHOL/HDL Ratio: 6.8 RATIO
Triglycerides: 179 mg/dL — ABNORMAL HIGH (ref <150)
VLDL: 36 mg/dL (ref 0–40)

## 2016-11-25 LAB — TROPONIN I
Troponin I: <0.03 ng/mL (ref <0.03)
Troponin I: <0.03 ng/mL (ref <0.03)

## 2016-11-25 LAB — PROTIME-INR
INR: 0.91
Prothrombin Time: 12.2 seconds (ref 11.4–15.2)

## 2016-11-25 MED ORDER — METOPROLOL TARTRATE 50 MG PO TABS
50.0000 mg | ORAL_TABLET | Freq: Two times a day (BID) | ORAL | Status: DC
  Administered 2016-11-25: 50 mg via ORAL
  Filled 2016-11-25: qty 1

## 2016-11-25 MED ORDER — LISINOPRIL 5 MG PO TABS: 5.0000 mg | ORAL_TABLET | Freq: Every day | ORAL | Status: DC

## 2016-11-25 MED ORDER — DIPHENHYDRAMINE HCL 50 MG/ML IJ SOLN
25.0000 mg | Freq: Once | INTRAMUSCULAR | Status: AC
  Administered 2016-11-25: 25 mg via INTRAVENOUS
  Filled 2016-11-25: qty 1

## 2016-11-25 MED ORDER — ASPIRIN 81 MG PO CHEW
81.0000 mg | CHEWABLE_TABLET | Freq: Every day | ORAL | Status: DC
Start: 2016-11-26 — End: 2016-11-26
  Administered 2016-11-26: 81 mg via ORAL
  Filled 2016-11-25: qty 1

## 2016-11-25 MED ORDER — CYCLOBENZAPRINE HCL 10 MG PO TABS: 10.0000 mg | ORAL_TABLET | Freq: Three times a day (TID) | ORAL | Status: DC | PRN

## 2016-11-25 MED ORDER — HEPARIN SODIUM (PORCINE) 5000 UNIT/ML IJ SOLN
5000.0000 [IU] | Freq: Three times a day (TID) | INTRAMUSCULAR | Status: DC
  Administered 2016-11-25 – 2016-11-26 (×3): 5000 [IU] via SUBCUTANEOUS
  Filled 2016-11-25 (×3): qty 1

## 2016-11-25 MED ORDER — DOCUSATE SODIUM 100 MG PO CAPS: 100.0000 mg | ORAL_CAPSULE | Freq: Two times a day (BID) | ORAL | Status: DC | PRN

## 2016-11-25 MED ORDER — PROCHLORPERAZINE EDISYLATE 5 MG/ML IJ SOLN
10.0000 mg | Freq: Once | INTRAMUSCULAR | Status: AC
  Administered 2016-11-25: 10 mg via INTRAVENOUS
  Filled 2016-11-25: qty 2

## 2016-11-25 MED ORDER — NITROGLYCERIN 0.4 MG SL SUBL: 0.4000 mg | SUBLINGUAL_TABLET | SUBLINGUAL | Status: DC | PRN

## 2016-11-25 MED ORDER — NICOTINE 21 MG/24HR TD PT24
21.0000 mg | MEDICATED_PATCH | Freq: Every day | TRANSDERMAL | Status: DC
  Administered 2016-11-26: 21 mg via TRANSDERMAL
  Filled 2016-11-25: qty 1

## 2016-11-25 MED ORDER — ASPIRIN 81 MG PO CHEW
324.0000 mg | CHEWABLE_TABLET | Freq: Once | ORAL | Status: AC
Start: 2016-11-25 — End: 2016-11-25
  Administered 2016-11-25: 324 mg via ORAL
  Filled 2016-11-25: qty 4

## 2016-11-25 MED ORDER — GABAPENTIN 300 MG PO CAPS
300.0000 mg | ORAL_CAPSULE | Freq: Two times a day (BID) | ORAL | Status: DC
  Administered 2016-11-25 – 2016-11-26 (×2): 300 mg via ORAL
  Filled 2016-11-25 (×2): qty 1

## 2016-11-25 MED ORDER — KETOROLAC TROMETHAMINE 30 MG/ML IJ SOLN
15.0000 mg | Freq: Once | INTRAMUSCULAR | Status: AC
  Administered 2016-11-25: 15 mg via INTRAVENOUS
  Filled 2016-11-25: qty 1

## 2016-11-25 MED ORDER — CLONIDINE HCL 0.1 MG PO TABS
0.1000 mg | ORAL_TABLET | Freq: Two times a day (BID) | ORAL | Status: DC
  Administered 2016-11-25: 0.1 mg via ORAL
  Filled 2016-11-25: qty 1

## 2016-11-25 NOTE — ED Notes (Signed)
Patient returned from MRI.

## 2016-11-25 NOTE — ED Provider Notes (Signed)
[facility] Emergency Department Provider Note  ____________________________________________   First MD Initiated Contact with Patient 11/25/16 1711     (approximate)  I have reviewed the triage vital signs and the nursing notes.   HISTORY  Chief Complaint Hypertension; Headache; and Numbness    HPI Kyle Olsen is a 56 y.o. male who is sent to the emergency department from his cardiologist Dr. Serita KyleEnd's office for 2 issues. First the patient reports aching pressure-like moderate severity left chest discomfort radiating to his left arm associated with tingling for the past week or so. 2 days ago she was evaluated in our emergency department and had a negative cardiac workup when to follow-up with cardiology today. The cardiologist was concerned that the patient's symptoms represented unstable angina and he recommended inpatient admission for provocative testing. Secondly the patient also reports 3-4 days of gradual onset not maximal onset severe headache unlike any headache he's ever had before. When he was here 2 days ago he had a CT angiogram of his chest and a CT scan of his head which were negative. He said on discharge his pain was improved but his headache is now recurred. He denies double vision or blurred vision. Nothing seems to make his headache better or worse.   Past Medical History:  Diagnosis Date  . Diverticulitis   . GI bleed   . HLD (hyperlipidemia)   . Hypertension     Patient Active Problem List   Diagnosis Date Noted  . Chest pain with moderate risk for cardiac etiology 11/26/2016  . HLD (hyperlipidemia) 11/26/2016  . Hypertensive emergency 11/25/2016  . Aortic atherosclerosis (HCC) 11/25/2016  . Tobacco abuse 11/25/2016    Past Surgical History:  Procedure Laterality Date  . APPENDECTOMY    . TONSILLECTOMY      Prior to Admission medications   Medication Sig Start Date End Date Taking? Authorizing Provider  cloNIDine (CATAPRES)  0.1 MG tablet Take 0.1 mg by mouth 2 (two) times daily.   Yes [provider]  cyclobenzaprine (FLEXERIL) 10 MG tablet Take 10 mg by mouth 3 (three) times daily as needed for muscle spasms.   Yes [provider]  gabapentin (NEURONTIN) 300 MG capsule Take 300 mg by mouth 2 (two) times daily.    Yes [provider]  metoprolol (LOPRESSOR) 50 MG tablet Take 50 mg by mouth daily.    Yes [provider]  amLODipine (NORVASC) 5 MG tablet Take 1 tablet (5 mg total) by mouth daily. 11/26/16   Enid BaasKalisetti, Radhika, MD  aspirin 81 MG chewable tablet Chew 1 tablet (81 mg total) by mouth daily. 11/26/16   Enid BaasKalisetti, Radhika, MD  atorvastatin (LIPITOR) 40 MG tablet Take 1 tablet (40 mg total) by mouth daily at 6 PM. 11/26/16   Enid BaasKalisetti, Radhika, MD  carvedilol (COREG) 12.5 MG tablet Take 1 tablet (12.5 mg total) by mouth 2 (two) times daily with a meal. 11/26/16   Enid BaasKalisetti, Radhika, MD  lisinopril (PRINIVIL,ZESTRIL) 20 MG tablet Take 1 tablet (20 mg total) by mouth daily. 11/26/16   Enid BaasKalisetti, Radhika, MD    Allergies Patient has no known allergies.  Family History  Problem Relation Age of Onset  . Heart disease Sister     Social History Social History  Substance Use Topics  . Smoking status: Current Every Day Smoker    Packs/day: 1.00    Years: 40.00    Types: Cigarettes  . Smokeless tobacco: Never Used  . Alcohol use No  Comment: occasionally (once per month)    Review of Systems Constitutional: No fever/chills Eyes: No visual changes. ENT: No sore throat. Cardiovascular: Positive chest pain. Respiratory: Denies shortness of breath. Gastrointestinal: No abdominal pain.  No nausea, no vomiting.  No diarrhea.  No constipation. Genitourinary: Negative for dysuria. Musculoskeletal: Negative for back pain. Skin: Negative for rash. Neurological: Positive for headache.   ____________________________________________   PHYSICAL EXAM:  VITAL SIGNS: ED  Triage Vitals  Enc Vitals Group     BP 11/25/16 1704 (!) 169/117     Pulse Rate 11/25/16 1704 81     Resp 11/25/16 1704 20     Temp 11/25/16 1704 99.7 F (37.6 C)     Temp Source 11/25/16 1704 Oral     SpO2 11/25/16 1704 99 %     Weight 11/25/16 1653 175 lb (79.4 kg)     Height 11/25/16 1653 5\' 7"  (1.702 m)     Head Circumference --      Peak Flow --      Pain Score --      Pain Loc --      Pain Edu? --      Excl. in GC? --     Constitutional: Alert and oriented 4 sitting in a darkened room shielding his eyes appears uncomfortable Eyes: PERRL EOMI. Head: Atraumatic. Nose: No congestion/rhinnorhea. Mouth/Throat: No trismus Neck: No stridor.  No meningismus Cardiovascular: Normal rate, regular rhythm. Grossly normal heart sounds.  Good peripheral circulation. Respiratory: Normal respiratory effort.  No retractions. Lungs CTAB and moving good air Gastrointestinal: Soft nontender Musculoskeletal: No lower extremity edema   Neurologic:  Normal speech and language. No gross focal neurologic deficits are appreciated. 5 out of 5 all 4 sensation intact to light touch all 4 Skin:  Skin is warm, dry and intact. No rash noted. Psychiatric: Mood and affect are normal. Speech and behavior are normal.    ____________________________________________   DIFFERENTIAL includes but not limited to  Unstable angina, non-ST elevation myocardial infarction, stable angina, stroke, migraine, cluster headache ____________________________________________   LABS (all labs ordered are listed, but only abnormal results are displayed)  Labs Reviewed  URINALYSIS, COMPLETE (UACMP) WITH MICROSCOPIC - Abnormal; Notable for the following:       Result Value   Color, Urine YELLOW (*)    APPearance CLEAR (*)    Hgb urine dipstick SMALL (*)    Squamous Epithelial / LPF 0-5 (*)    All other components within normal limits  BASIC METABOLIC PANEL - Abnormal; Notable for the following:    Glucose, Bld 109  (*)    All other components within normal limits  LIPID PANEL - Abnormal; Notable for the following:    Cholesterol 232 (*)    Triglycerides 179 (*)    HDL 34 (*)    LDL Cholesterol 162 (*)    All other components within normal limits  CBC - Abnormal; Notable for the following:    RBC 5.99 (*)    All other components within normal limits  PROTIME-INR  BRAIN NATRIURETIC PEPTIDE  CBC WITH DIFFERENTIAL/PLATELET  HEPATIC FUNCTION PANEL  TROPONIN I  TROPONIN I  TROPONIN I  TROPONIN I  BASIC METABOLIC PANEL  TSH  CBC WITH DIFFERENTIAL/PLATELET  HEMOGLOBIN A1C  HIV ANTIBODY (ROUTINE TESTING)  CATECHOLAMINES,UR.,FREE,24 HR  ALDOSTERONE + RENIN ACTIVITY W/ RATIO  CORTISOL-AM, BLOOD    First troponin negativeof acute ischemia __________________________________________  EKG  ED ECG REPORT I, Merrily Brittle, the attending physician, personally viewed  and interpreted this ECG.  Date: 11/25/2016 Rate: 94 Rhythm: normal sinus rhythm QRS Axis: normal Intervals: normal ST/T Wave abnormalities: normal Narrative Interpretation: unremarkable  ____________________________________________  RADIOLOGY  MRI of the brain with no acute disease ____________________________________________   PROCEDURES  Procedure(s) performed: no  Procedures  Critical Care performed: no  Observation: no ____________________________________________   INITIAL IMPRESSION / ASSESSMENT AND PLAN / ED COURSE  Pertinent labs & imaging results that were available during my care of the patient were reviewed by me and considered in my medical decision making (see chart for details).  The patient arrives with 2 complaints. He is primarily concerned about his headache however his cardiologist is more concerned about his chest pain. Regarding his headache I agree with the workup done 2 days ago including a head CT and a CT angiogram of the chest but today this warrants further investigation with an MRI. I  will give him prochlorperazine as well as Toradol and Benadryl to help the headache. Regarding his chest pain fortunately today his EKG has no changes in his first troponin is negative however his story is concerning and he requires inpatient admission for full cardiac rule out and inpatient risk stratification.  After prochlorperazine and Benadryl the patient's pain is nearly resolved. His MRI is reassuring however he still requires admission for cardiac rule out.      ____________________________________________   FINAL CLINICAL IMPRESSION(S) / ED DIAGNOSES  Final diagnoses:  Unstable angina (HCC)      NEW MEDICATIONS STARTED DURING THIS VISIT:  Current Discharge Medication List    START taking these medications   Details  amLODipine (NORVASC) 5 MG tablet Take 1 tablet (5 mg total) by mouth daily. Qty: 30 tablet, Refills: 2    aspirin 81 MG chewable tablet Chew 1 tablet (81 mg total) by mouth daily. Qty: 30 tablet, Refills: 2    atorvastatin (LIPITOR) 40 MG tablet Take 1 tablet (40 mg total) by mouth daily at 6 PM. Qty: 30 tablet, Refills: 2    carvedilol (COREG) 12.5 MG tablet Take 1 tablet (12.5 mg total) by mouth 2 (two) times daily with a meal. Qty: 60 tablet, Refills: 2    lisinopril (PRINIVIL,ZESTRIL) 20 MG tablet Take 1 tablet (20 mg total) by mouth daily. Qty: 30 tablet, Refills: 2         Note:  This document was prepared using Dragon voice recognition software and may include unintentional dictation errors.     Merrily Brittle, MD 11/26/16 1401

## 2016-11-25 NOTE — Patient Instructions (Signed)
Patient taken to the ER.

## 2016-11-25 NOTE — Progress Notes (Signed)
New Outpatient Visit Date: 11/25/2016  Referring Provider: Nita Sicklearolina Veronese, MD [facility] Emergency Department  Chief Complaint: Headache, chest pain, and high blood pressure  HPI:  Mr. Donnetta SimpersStreets is a 56 y.o. male who is being seen today for the evaluation of hypertension and chest pain at the request of Dr. Darden DatesVaronese. He has a history of hypertension, GI bleed, diverticulitis, and tobacco use. He notes that his blood pressure has been suboptimally controlled for some time. 4-5 days ago, he developed significant headache and substernal chest pain with labile blood pressures, prompting him to go to the emergency department. Workup there was unrevealing except for significantly elevated blood pressure. He was given an injection of morphine with resolution of his headache and instructed to follow-up with us. He has never been seen by a cardiologist before.  Today, Mr. Dimaggio reports that he is having severe headache is bilateral beginning near the occiput and radiating forward. He has also spirits pain behind the right eye. Further questioning reveals numbness and weakness involving the left hand as well as mild edema of the left hand. He notes that this began even before his ED visit 2 days ago, though it seems to have been more pronounced today. He continues to have chest pain that he describes as substernal pressure without radiation. It is currently 3-4/10 in intensity but was 10/10 in 2 days ago when he presented to the ER. It is worsened with activity and accompanied by some shortness of breath. It also worsens with deep inspiration. Due to elevated blood pressure and the aforementioned symptoms, Mr. Mezo has been doubling up on metoprolol, now taking 50 mg twice a day. He has also been using his clonidine as prescribed (0.1 mg twice a day). He has not had any recent tick bites, rashes, or fevers, though he felt a little bit sweaty earlier  today.  --------------------------------------------------------------------------------------------------  Cardiovascular History & Procedures: Cardiovascular Problems:  Chest pain  Uncontrolled hypertension  Risk Factors:  Hypertension, male gender, tobacco use, and questionable family history  Cath/PCI:  None  CV Surgery:  None  EP Procedures and Devices:  None  Non-Invasive Evaluation(s):  CTA chest (11/23/16): No thoracic aortic aneurysm or dissection. Aortic and coronary atherosclerotic calcification evident. Bullous disease in the right upper lobe and mild bibasilar scarring.  Recent CV Pertinent Labs: Lab Results  Component Value Date   K 4.4 11/23/2016   BUN 13 11/23/2016   CREATININE 0.98 11/23/2016    --------------------------------------------------------------------------------------------------  Past Medical History:  Diagnosis Date  . Diverticulitis   . Hypertension     Past Surgical History:  Procedure Laterality Date  . APPENDECTOMY    . TONSILLECTOMY      Current Meds  Medication Sig  . cloNIDine (CATAPRES) 0.1 MG tablet Take 0.1 mg by mouth 2 (two) times daily.  . cyclobenzaprine (FLEXERIL) 10 MG tablet Take 10 mg by mouth 3 (three) times daily as needed for muscle spasms.  Marland Kitchen. gabapentin (NEURONTIN) 300 MG capsule Take 300 mg by mouth 2 (two) times daily.   . metoprolol (LOPRESSOR) 50 MG tablet Take 50 mg by mouth daily.     Allergies: Patient has no known allergies.  Social History   Social History  . Marital status: Single    Spouse name: N/A  . Number of children: N/A  . Years of education: N/A   Occupational History  . Not on file.   Social History Main Topics  . Smoking status: Current Every Day Smoker    Packs/day:  1.00    Types: Cigarettes  . Smokeless tobacco: Never Used  . Alcohol use No  . Drug use: Unknown  . Sexual activity: Not on file   Other Topics Concern  . Not on file   Social History Narrative   . No narrative on file    No family history on file.  Review of Systems: A 12-system review of systems was performed and was negative except as noted in the HPI.  --------------------------------------------------------------------------------------------------  Physical Exam: BP (!) 180/114 (BP Location: Left Arm, Patient Position: Sitting, Cuff Size: Normal)   Pulse 94   Ht 5\' 7"  (1.702 m)   Wt 175 lb (79.4 kg)   SpO2 98%   BMI 27.41 kg/m   General:  Overweight man smelling of tobacco smoke, seated in the exam room. He appears uncomfortable and frequently clutches his head. He is accompanied by his fiance. HEENT: Conjunctiva are mildly injected. There is no scleral icterus. Moist mucous membranes. OP clear. Neck: Supple without lymphadenopathy, thyromegaly, JVD, or HJR. No carotid bruit. Lungs: Normal work of breathing. Mildly diminished breath sounds throughout without wheezes or crackles. Heart: Regular rate and rhythm without murmurs, rubs, or gallops. Non-displaced PMI. Abd: Bowel sounds present. Soft and nondistended without HSM. Mild right upper quadrant tenderness. Ext: No lower extremity edema. 1+ radial and pedal pulses bilaterally. Skin: Warm and dry without rash. Neuro: Disconjugate gaze is noted, which the patient states is chronic. Otherwise, CNIII-XII intact. Fine touch sensation is intact bilaterally. There is 5 out of 5 right upper and lower extremity strength. There is 4/5 strength with flexion and extension of the left elbow, as well as extension of the knee. Psych: Normal mood and affect.  EKG:  Normal sinus rhythm with left atrial enlargement and nonspecific ST/T changes.  Lab Results  Component Value Date   WBC 8.4 11/23/2016   HGB 17.6 11/23/2016   HCT 51.0 11/23/2016   MCV 85.5 11/23/2016   PLT 234 11/23/2016    Lab Results  Component Value Date   NA 139 11/23/2016   K 4.4 11/23/2016   CL 102 11/23/2016   CO2 27 11/23/2016   BUN 13 11/23/2016    CREATININE 0.98 11/23/2016   GLUCOSE 109 (H) 11/23/2016    No results found for: CHOL, HDL, LDLCALC, LDLDIRECT, TRIG, CHOLHDL  --------------------------------------------------------------------------------------------------  ASSESSMENT AND PLAN: Hypertensive emergency Mr. Bayona presents with marked hypertension and multiple symptoms including chest pain, headache, and worsening left arm weakness. Symptoms are subacute, having started at least 3-4 days ago. He was evaluated in the ED 2 days ago and referred to [place] without any interim changes in his medications. Given progression of symptoms and uncontrolled hypertension, I have referred him back to the [facility] emergency department for evaluation and likely admission. I suggest that stroke be excluded; brain MRI should be considered. Given continued chest pain, which is also present at rest, serial troponins and ischemia evaluation prior to discharge are recommended. Echocardiogram should also be obtained. Long-term, the patient's blood pressure regimen should be optimized. I would advocate for weaning him off clonidine and adding an ACE inhibitor or ARB as well as amlodipine. Urine drug test is also suggested.  Unstable angina Symptoms are concerning for unstable angina, given that they are present at rest. As above, I suggest additional serial troponins and ischemia evaluation (myocardial perfusion stress test versus catheterization) based on troponin trent and symptoms with better blood pressure control prior to discharge. Of note, multivessel coronary artery calcification noted on recent  CTA of the chest, which I have personally reviewed.  Aortic atherosclerosis After squatting calcifications were noted on recent medical therapy, including better blood pressure control and consideration of statin therapy. A lipid panel should be obtained during the patient's hospital stay.  Tobacco abuse Patient continues to smoke. Tobacco cessation was  encouraged.  Follow-up: Return to clinic after ED visit/hospitalization.  Yvonne Kendall, MD 11/25/2016 4:24 PM

## 2016-11-25 NOTE — ED Notes (Signed)
Patient transported to MRI 

## 2016-11-25 NOTE — ED Notes (Signed)
Brought over from PCP to be evaluated for headache  htn and left arm numbness

## 2016-11-25 NOTE — Plan of Care (Signed)
Problem: Pain Managment: Goal: General experience of comfort will improve Outcome: Progressing Reports headache resolved on arrival to unit.  No neuro deficits noted.

## 2016-11-25 NOTE — H&P (Signed)
[employer] [facility] [place] at [facility]   PATIENT NAME: Kyle Olsen    MR#:  [id number]  DATE OF BIRTH:  Jun 16, 1960  DATE OF ADMISSION:  11/25/2016  PRIMARY CARE PHYSICIAN: Placey, Chales Abrahams, NP   REQUESTING/REFERRING PHYSICIAN: Rifenbark  CHIEF COMPLAINT:   Chief Complaint  Patient presents with  . Hypertension  . Headache  . Numbness    HISTORY OF PRESENT ILLNESS: Kyle Olsen  is a 56 y.o. male with a known history of Hypertension, diverticulitis- Was in emergency room with chest pain 2 days ago and discharged home with arrangement of follow-up in cardiology clinic. Today he went to cardiology clinic with [facility] cardiology group. He had complain of chest pain and headache when he went there and his blood pressure was noted around 200 systolic. Concerned with this cardiologist of his sent him to emergency room for management of hypertensive urgency and because of his typical symptoms are also planning to do either a nuclear stress test or cardiac catheterization depending on his troponins results tonight.   patient's headache resolved after getting injections and blood pressure came some under control, MRI of the brain is negative for any acute stroke.  PAST MEDICAL HISTORY:   Past Medical History:  Diagnosis Date  . Diverticulitis   . GI bleed   . Hypertension     PAST SURGICAL HISTORY: Past Surgical History:  Procedure Laterality Date  . APPENDECTOMY    . TONSILLECTOMY      SOCIAL HISTORY:  Social History  Substance Use Topics  . Smoking status: Current Every Day Smoker    Packs/day: 1.00    Years: 40.00    Types: Cigarettes  . Smokeless tobacco: Never Used  . Alcohol use No     Comment: occasionally (once per month)    FAMILY HISTORY:  Family History  Problem Relation Age of Onset  . Heart disease Sister     DRUG ALLERGIES: No Known Allergies  REVIEW OF SYSTEMS:   CONSTITUTIONAL: No fever, fatigue or weakness.  EYES: No blurred or  double vision.  EARS, NOSE, AND THROAT: No tinnitus or ear pain.  RESPIRATORY: No cough, shortness of breath, wheezing or hemoptysis.  CARDIOVASCULAR: positive for chest pain, no orthopnea, edema.  GASTROINTESTINAL: No nausea, vomiting, diarrhea or abdominal pain.  GENITOURINARY: No dysuria, hematuria.  ENDOCRINE: No polyuria, nocturia,  HEMATOLOGY: No anemia, easy bruising or bleeding SKIN: No rash or lesion. MUSCULOSKELETAL: No joint pain or arthritis.   NEUROLOGIC: No tingling, numbness, weakness.  PSYCHIATRY: No anxiety or depression.   MEDICATIONS AT HOME:  Prior to Admission medications   Medication Sig Start Date End Date Taking? Authorizing Provider  cloNIDine (CATAPRES) 0.1 MG tablet Take 0.1 mg by mouth 2 (two) times daily.   Yes [provider]  cyclobenzaprine (FLEXERIL) 10 MG tablet Take 10 mg by mouth 3 (three) times daily as needed for muscle spasms.   Yes [provider]  gabapentin (NEURONTIN) 300 MG capsule Take 300 mg by mouth 2 (two) times daily.    Yes [provider]  metoprolol (LOPRESSOR) 50 MG tablet Take 50 mg by mouth daily.    Yes [provider]      PHYSICAL EXAMINATION:   VITAL SIGNS: Blood pressure (!) 157/111, pulse 74, temperature 99.7 F (37.6 C), temperature source Oral, resp. rate 18, height 5\' 7"  (1.702 m), weight 79.4 kg (175 lb), SpO2 98 %.  GENERAL:  56 y.o.-year-old patient lying in the bed with no acute distress.  EYES: Pupils equal, round, reactive to light and accommodation. No scleral icterus. Extraocular muscles intact.  HEENT: Head atraumatic, normocephalic. Oropharynx and nasopharynx clear.  NECK:  Supple, no jugular venous distention. No thyroid enlargement, no tenderness.  LUNGS: Normal breath sounds bilaterally, no wheezing, rales,rhonchi or crepitation. No use of accessory muscles of respiration.  CARDIOVASCULAR: S1, S2 normal. No murmurs, rubs, or gallops.  ABDOMEN: Soft, nontender,  nondistended. Bowel sounds present. No organomegaly or mass.  EXTREMITIES: No pedal edema, cyanosis, or clubbing.  NEUROLOGIC: Cranial nerves II through XII are intact. Muscle strength 5/5 in all extremities. Sensation intact. Gait not checked.  PSYCHIATRIC: The patient is alert and oriented x 3.  SKIN: No obvious rash, lesion, or ulcer.   LABORATORY PANEL:   CBC  Recent Labs Lab 11/23/16 0746 11/25/16 1825  WBC 8.4 9.1  HGB 17.6 16.9  HCT 51.0 48.5  PLT 234 230  MCV 85.5 84.7  MCH 29.5 29.5  MCHC 34.5 34.9  RDW 14.7* 14.1  LYMPHSABS  --  2.2  MONOABS  --  0.6  EOSABS  --  0.3  BASOSABS  --  0.1   ------------------------------------------------------------------------------------------------------------------  Chemistries   Recent Labs Lab 11/23/16 0746 11/25/16 1825  NA 139 139  K 4.4 3.6  CL 102 106  CO2 27 25  GLUCOSE 109* 109*  BUN 13 13  CREATININE 0.98 0.95  CALCIUM 9.9 9.2  AST  --  26  ALT  --  25  ALKPHOS  --  53  BILITOT  --  1.0   ------------------------------------------------------------------------------------------------------------------ estimated creatinine clearance is 89.8 mL/min (by C-G formula based on SCr of 0.95 mg/dL). ------------------------------------------------------------------------------------------------------------------ No results for input(s): TSH, T4TOTAL, T3FREE, THYROIDAB in the last 72 hours.  Invalid input(s): FREET3   Coagulation profile  Recent Labs Lab 11/25/16 1719  INR 0.91   ------------------------------------------------------------------------------------------------------------------- No results for input(s): DDIMER in the last 72 hours. -------------------------------------------------------------------------------------------------------------------  Cardiac Enzymes  Recent Labs Lab 11/23/16 0746 11/23/16 1044 11/25/16 1825  TROPONINI <0.03 <0.03 <0.03    ------------------------------------------------------------------------------------------------------------------ Invalid input(s): POCBNP  ---------------------------------------------------------------------------------------------------------------  Urinalysis No results found for: COLORURINE, APPEARANCEUR, LABSPEC, PHURINE, GLUCOSEU, HGBUR, BILIRUBINUR, KETONESUR, PROTEINUR, UROBILINOGEN, NITRITE, LEUKOCYTESUR   RADIOLOGY: Mr Brain Wo Contrast (neuro Protocol)  Result Date: 11/25/2016 CLINICAL DATA:  New headache, left arm weakness.  Hypertension EXAM: MRI HEAD WITHOUT CONTRAST TECHNIQUE: Multiplanar, multiecho pulse sequences of the brain and surrounding structures were obtained without intravenous contrast. COMPARISON:  CT head 11/23/2016 FINDINGS: Brain: Diffusion imaging degraded by artifact from metal in the region of the right orbit. Allowing for this, no acute infarct identified. Ventricle size normal. Negative for hemorrhage or mass. Scattered small hyperintensities in the frontal white matter bilaterally. Vascular: Normal arterial flow void Skull and upper cervical spine: Negative Sinuses/Orbits: Obscured by artifact Other: None IMPRESSION: No acute abnormality. Image quality degraded by artifact from dental work. Electronically Signed   By: Marlan Palauharles  Clark M.D.   On: 11/25/2016 19:38    EKG: Orders placed or performed in visit on 11/25/16  . EKG 12-Lead    IMPRESSION AND PLAN:  * Hypertensive urgency    blood pressure came down to safe range after receiving medications for his headache control.   I will resume his oral medications of metoprolol, clonidine and add lisinopril.   We may need to cut down on his clonidine on discharge and send him home with metoprolol and lisinopril.  * Unstable angina   Cardiology saw in the office and suggested blood pressure management first  and follow serial troponin.   Plan is to do cardiac catheterization or nuclear stress test  tomorrow morning depending on his troponin results.   I also ordered a lipid panel and hemoglobin A1c for further adjustment of treatments.  * Active smoker   Counseled to quit smoking for 4 minutes and offered nicotine patch.  * Headache    Resolved after receiving IV medications in ER, MRI of the brain is negative for acute stroke.   All the records are reviewed and case discussed with ED provider. Management plans discussed with the patient, family and they are in agreement.  CODE STATUS: full code  Code Status History    This patient does not have a recorded code status. Please follow your organizational policy for patients in this situation.       TOTAL TIME TAKING CARE OF THIS PATIENT: 50 minutes.    Altamese Dilling M.D on 11/25/2016   Between 7am to 6pm - Pager - [phone]  After 6pm go to [email or web address] - password [email or web address]  [facility]  Office  [phone]  CC: Primary care physician; Hilbert Corrigan Chales Abrahams, NP   Note: This dictation was prepared with Dragon dictation along with smaller phrase technology. Any transcriptional errors that result from this process are unintentional.

## 2016-11-26 ENCOUNTER — Observation Stay (HOSPITAL_BASED_OUTPATIENT_CLINIC_OR_DEPARTMENT_OTHER)
Admit: 2016-11-26 | Discharge: 2016-11-26 | Disposition: A | Discharge status: Home / Self Care | Payer: Self-pay | Attending: Physician Assistant | Admitting: Physician Assistant

## 2016-11-26 ENCOUNTER — Other Ambulatory Visit: Payer: Self-pay

## 2016-11-26 ENCOUNTER — Observation Stay: Payer: Self-pay

## 2016-11-26 ENCOUNTER — Encounter: Payer: Self-pay | Admitting: Physician Assistant

## 2016-11-26 ENCOUNTER — Observation Stay (HOSPITAL_BASED_OUTPATIENT_CLINIC_OR_DEPARTMENT_OTHER): Payer: Self-pay

## 2016-11-26 DIAGNOSIS — R079 Chest pain, unspecified: Secondary | ICD-10-CM

## 2016-11-26 DIAGNOSIS — E785 Hyperlipidemia, unspecified: Secondary | ICD-10-CM | POA: Diagnosis present

## 2016-11-26 DIAGNOSIS — I161 Hypertensive emergency: Secondary | ICD-10-CM

## 2016-11-26 DIAGNOSIS — R072 Precordial pain: Secondary | ICD-10-CM

## 2016-11-26 LAB — BASIC METABOLIC PANEL
Anion gap: 8 (ref 5–15)
BUN: 14 mg/dL (ref 6–20)
CHLORIDE: 106 mmol/L (ref 101–111)
CO2: 25 mmol/L (ref 22–32)
Calcium: 9.3 mg/dL (ref 8.9–10.3)
Creatinine, Ser: 1.04 mg/dL (ref 0.61–1.24)
GFR calc non Af Amer: >60 mL/min (ref >60)
GLUCOSE: 97 mg/dL (ref 65–99)
POTASSIUM: 4.2 mmol/L (ref 3.5–5.1)
Sodium: 139 mmol/L (ref 135–145)

## 2016-11-26 LAB — NM MYOCAR MULTI W/SPECT W/WALL MOTION / EF
CHL CUP NUCLEAR SDS: 1
CHL CUP RESTING HR STRESS: 65 {beats}/min
LVDIAVOL: 49 mL (ref 62–150)
LVSYSVOL: 14 mL
NUC STRESS TID: 1.11
Peak HR: 90 {beats}/min
Percent HR: 54 %
SRS: 0
SSS: 1

## 2016-11-26 LAB — CORTISOL-AM, BLOOD: CORTISOL - AM: 7.7 ug/dL (ref 6.7–22.6)

## 2016-11-26 LAB — CBC
HEMATOCRIT: 51.6 % (ref 40.0–52.0)
HEMOGLOBIN: 17.5 g/dL (ref 13.0–18.0)
MCH: 29.2 pg (ref 26.0–34.0)
MCHC: 33.9 g/dL (ref 32.0–36.0)
MCV: 86.2 fL (ref 80.0–100.0)
Platelets: 234 10*3/uL (ref 150–440)
RBC: 5.99 MIL/uL — AB (ref 4.40–5.90)
RDW: 14.4 % (ref 11.5–14.5)
WBC: 7.5 10*3/uL (ref 3.8–10.6)

## 2016-11-26 LAB — TSH: TSH: 3.334 u[IU]/mL (ref 0.350–4.500)

## 2016-11-26 LAB — TROPONIN I: Troponin I: <0.03 ng/mL (ref <0.03)

## 2016-11-26 LAB — ECHOCARDIOGRAM COMPLETE
Height: 67 in
Weight: 2788.8 oz

## 2016-11-26 MED ORDER — ATORVASTATIN CALCIUM 20 MG PO TABS
40.0000 mg | ORAL_TABLET | Freq: Every day | ORAL | Status: DC
  Administered 2016-11-26: 40 mg via ORAL
  Filled 2016-11-26: qty 2

## 2016-11-26 MED ORDER — CARVEDILOL 12.5 MG PO TABS: 12.5000 mg | ORAL_TABLET | Freq: Two times a day (BID) | ORAL | 2 refills | Status: AC

## 2016-11-26 MED ORDER — CARVEDILOL 12.5 MG PO TABS
12.5000 mg | ORAL_TABLET | Freq: Two times a day (BID) | ORAL | Status: DC
  Administered 2016-11-26: 12.5 mg via ORAL
  Filled 2016-11-26: qty 1

## 2016-11-26 MED ORDER — REGADENOSON 0.4 MG/5ML IV SOLN
0.4000 mg | Freq: Once | INTRAVENOUS | Status: AC
  Administered 2016-11-26: 0.4 mg via INTRAVENOUS

## 2016-11-26 MED ORDER — AMLODIPINE BESYLATE 5 MG PO TABS: 5.0000 mg | ORAL_TABLET | Freq: Every day | ORAL | 2 refills | Status: DC

## 2016-11-26 MED ORDER — LISINOPRIL 20 MG PO TABS
20.0000 mg | ORAL_TABLET | Freq: Every day | ORAL | Status: DC
  Administered 2016-11-26: 20 mg via ORAL
  Filled 2016-11-26: qty 1

## 2016-11-26 MED ORDER — TECHNETIUM TC 99M TETROFOSMIN IV KIT
28.8800 | PACK | Freq: Once | INTRAVENOUS | Status: AC | PRN
  Administered 2016-11-26: 28.88 via INTRAVENOUS

## 2016-11-26 MED ORDER — AMLODIPINE BESYLATE 5 MG PO TABS
5.0000 mg | ORAL_TABLET | Freq: Every day | ORAL | Status: DC
  Administered 2016-11-26: 5 mg via ORAL
  Filled 2016-11-26: qty 1

## 2016-11-26 MED ORDER — LISINOPRIL 10 MG PO TABS: 10.0000 mg | ORAL_TABLET | Freq: Every day | ORAL | Status: DC

## 2016-11-26 MED ORDER — ASPIRIN 81 MG PO CHEW: 81.0000 mg | CHEWABLE_TABLET | Freq: Every day | ORAL | 2 refills | Status: AC

## 2016-11-26 MED ORDER — LISINOPRIL 20 MG PO TABS: 20.0000 mg | ORAL_TABLET | Freq: Every day | ORAL | 2 refills | Status: AC

## 2016-11-26 MED ORDER — TECHNETIUM TC 99M TETROFOSMIN IV KIT
12.9600 | PACK | Freq: Once | INTRAVENOUS | Status: AC | PRN
  Administered 2016-11-26: 12.96 via INTRAVENOUS

## 2016-11-26 MED ORDER — ATORVASTATIN CALCIUM 40 MG PO TABS: 40.0000 mg | ORAL_TABLET | Freq: Every day | ORAL | 2 refills | Status: AC

## 2016-11-26 NOTE — Progress Notes (Signed)
Patient is being discharge home as per order, discharge instruction provided, iv removed tele removed , medication reviewed with patient and patient verbalized understanding of the instruction

## 2016-11-26 NOTE — Progress Notes (Signed)
Called lab again about the container, they still haven t received it from [facility]

## 2016-11-26 NOTE — Consult Note (Signed)
Cardiology Consultation Note  Patient ID: Kyle Olsen, MRN: [id number], DOB/AGE: 54/05/1960 54 y.o. Admit date: 11/25/2016   Date of Consult: 11/26/2016 Primary Physician: Lavinia SharpsPlacey, Mary Ann, NP Primary Cardiologist: Dr. Okey DupreEnd, MD Requesting Physician: Dr. Elisabeth PigeonVachhani, MD  Chief Complaint: Hypertensive emergency/HA/chest pain Reason for Consult: Chest pain  HPI: Kyle BirksBill Brewton is a 56 y.o. male who is being seen today for the evaluation of chest pain at the request of Dr. Elisabeth PigeonVachhani, MD. Patient has a h/o hypertensions originally diagnosed ~ 1-2 years prior in [place] on metoprolol and clonidine, diverticulitis, prior GI bleed and HLD who [facility] from our office on 7/17 with hypertensive emergency with SBP > 200 mmHg.  He has noted his blood pressure has been suboptimally controlled for some time. 5-6 days ago, he developed significant headache and substernal chest pain with labile blood pressures, prompting him to go to the emergency department. Workup there was unrevealing except for significantly elevated blood pressure. He was given an injection of morphine with resolution of his headache and instructed to follow-up with cardiology. He has never been seen by a cardiologist before.  At his ED follow up on 11/24/16, the patient reported that he was having severe headache that was bilateral beginning near the occiput and radiating forward. He also noted pain behind the right eye. Further questioning revealed numbness and weakness involving the left hand as well as mild edema of the left hand. He noted that this began even before his ED visit 3 days ago, though it seemed to have been more pronounced on 7/17. He continued to have chest pain that he described as substernal pressure without radiation. It was 3-4/10 in intensity on 7/17, but was 10/10 in 3 days ago when he presented to the ER. It was worsened with activity and accompanied by some shortness of breath. It also worsened with deep  inspiration. Due to elevated blood pressure and the aforementioned symptoms, the patient had been doubling up on metoprolol, now taking 50 mg twice a day. He has also been using his clonidine as prescribed (0.1 mg twice a day). He has not had any recent tick bites, rashes, or fevers, though he felt a little bit sweaty earlier today. Because of his ongoing symptoms and elevated BP in our office of 180/114 he was sent back to [facility] ED for further evaluation.   Upon the patient's arrival to [facility] they were found to have BP 169/117, HR 81 bpm, temp 99.7, oxygen saturation 99% on room air, weight 175 pounds. EKG not acute as below, CXR not done. MRI brain not acute though exam was limited by dental artifact. Labs showed troponin negative x 2, BNP 19, normal liver function, unremarkable CBC, SCr 0.95, K+ 3.6, LDL 162, no TSH. He was continued on home medications at time of admission. BP has improved to 162/90. He no longer has a headache or chest pain.   Past Medical History:  Diagnosis Date  . Diverticulitis   . GI bleed   . HLD (hyperlipidemia)   . Hypertension       Most Recent Cardiac Studies: none   Surgical History:  Past Surgical History:  Procedure Laterality Date  . APPENDECTOMY    . TONSILLECTOMY       Home Meds: Prior to Admission medications   Medication Sig Start Date End Date Taking? Authorizing Provider  cloNIDine (CATAPRES) 0.1 MG tablet Take 0.1 mg by mouth 2 (two) times daily.   Yes [provider]  cyclobenzaprine (FLEXERIL) 10  MG tablet Take 10 mg by mouth 3 (three) times daily as needed for muscle spasms.   Yes [provider]  gabapentin (NEURONTIN) 300 MG capsule Take 300 mg by mouth 2 (two) times daily.    Yes [provider]  metoprolol (LOPRESSOR) 50 MG tablet Take 50 mg by mouth daily.    Yes [provider]    Inpatient Medications:  . aspirin  81 mg Oral Daily  . atorvastatin  40 mg Oral q1800  . cloNIDine  0.1 mg Oral BID  .  gabapentin  300 mg Oral BID  . heparin  5,000 Units Subcutaneous Q8H  . lisinopril  10 mg Oral Daily  . metoprolol tartrate  50 mg Oral BID  . nicotine  21 mg Transdermal Daily     Allergies: No Known Allergies  Social History   Social History  . Marital status: Single    Spouse name: N/A  . Number of children: N/A  . Years of education: N/A   Occupational History  . Not on file.   Social History Main Topics  . Smoking status: Current Every Day Smoker    Packs/day: 1.00    Years: 40.00    Types: Cigarettes  . Smokeless tobacco: Never Used  . Alcohol use No     Comment: occasionally (once per month)  . Drug use: Unknown  . Sexual activity: Not on file   Other Topics Concern  . Not on file   Social History Narrative  . No narrative on file     Family History  Problem Relation Age of Onset  . Heart disease Sister      Review of Systems: Review of Systems  Constitutional: Positive for malaise/fatigue. Negative for chills, diaphoresis, fever and weight loss.  HENT: Negative for congestion.   Eyes: Negative for discharge and redness.  Respiratory: Negative for cough, hemoptysis, sputum production, shortness of breath and wheezing.   Cardiovascular: Positive for chest pain. Negative for palpitations, orthopnea, claudication, leg swelling and PND.  Gastrointestinal: Negative for abdominal pain, blood in stool, heartburn, melena, nausea and vomiting.  Genitourinary: Negative for hematuria.  Musculoskeletal: Negative for falls and myalgias.  Skin: Negative for rash.  Neurological: Positive for sensory change, weakness and headaches. Negative for dizziness, tingling, tremors, speech change, focal weakness and loss of consciousness.  Endo/Heme/Allergies: Does not bruise/bleed easily.  Psychiatric/Behavioral: Negative for substance abuse. The patient is not nervous/anxious.   All other systems reviewed and are negative.   Labs:  Recent Labs  11/23/16 1044  11/25/16 1825 11/25/16 2234 11/26/16 0216  TROPONINI <0.03 <0.03 <0.03 <0.03   Lab Results  Component Value Date   WBC 7.5 11/26/2016   HGB 17.5 11/26/2016   HCT 51.6 11/26/2016   MCV 86.2 11/26/2016   PLT 234 11/26/2016     Recent Labs Lab 11/25/16 1825  NA 139  K 3.6  CL 106  CO2 25  BUN 13  CREATININE 0.95  CALCIUM 9.2  PROT 7.6  BILITOT 1.0  ALKPHOS 53  ALT 25  AST 26  GLUCOSE 109*   Lab Results  Component Value Date   CHOL 232 (H) 11/25/2016   HDL 34 (L) 11/25/2016   LDLCALC 162 (H) 11/25/2016   TRIG 179 (H) 11/25/2016   No results found for: DDIMER  Radiology/Studies:  Dg Chest 2 View  Result Date: 11/23/2016 IMPRESSION: 1.  No acute cardiopulmonary abnormality. 2. Stable bibasilar scarring or atelectasis. Possible upper lobe emphysema and scarring. Electronically Signed  By: Odessa Fleming M.D.   On: 11/23/2016 09:02   Ct Head Wo Contrast  Result Date: 11/23/2016 IMPRESSION: Negative noncontrast head CT. Electronically Signed   By: Myles Rosenthal M.D.   On: 11/23/2016 08:17   Mr Brain Wo Contrast (neuro Protocol)  Result Date: 11/25/2016 IMPRESSION: No acute abnormality. Image quality degraded by artifact from dental work. Electronically Signed   By: Marlan Palau M.D.   On: 11/25/2016 19:38   Ct Angio Chest Aorta W And/or Wo Contrast  Result Date: 11/23/2016 IMPRESSION: 1. No thoracic aortic aneurysm or dissection. Areas of atherosclerotic calcification noted in the aorta and left subclavian artery. Scattered foci of coronary artery calcification noted. No evident pulmonary embolus. 2. Bullous disease in right upper lobe. Mild bibasilar lung scarring. No edema or consolidation. 3. Nodular lesions in right upper lobe and right middle lobe, largest measuring 5 mm. No follow-up needed if patient is low-risk (and has no known or suspected primary neoplasm). Non-contrast chest CT can be considered in 12 months if patient is high-risk. This recommendation follows the  consensus statement: Guidelines for Management of Incidental Pulmonary Nodules Detected on CT Images: From the Fleischner Society 2017; Radiology 2017; 284:228-243. 4.  No demonstrable adenopathy. 5.  Cholelithiasis. 6.  Incomplete visualization 3 mm calculus upper pole right kidney. Aortic Atherosclerosis (ICD10-I70.0). Electronically Signed   By: Bretta Bang III M.D.   On: 11/23/2016 10:09    EKG: Interpreted by me showed: NSR, 94 bpm, LAE, nonspecific st/t changes Telemetry: Interpreted by me showed: NSR, 60s bpm  Weights: Filed Weights   11/25/16 1653 11/25/16 2151  Weight: 175 lb (79.4 kg) 174 lb 4.8 oz (79.1 kg)     Physical Exam: Blood pressure (!) 162/90, pulse 64, temperature 97.9 F (36.6 C), temperature source Oral, resp. rate 17, height 5\' 7"  (1.702 m), weight 174 lb 4.8 oz (79.1 kg), SpO2 96 %. Body mass index is 27.3 kg/m. General: Well developed, well nourished, in no acute distress. Head: Normocephalic, atraumatic, sclera non-icteric, no xanthomas, nares are without discharge. 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 JVD not elevated. Lungs: Clear bilaterally to auscultation without wheezes, rales, or rhonchi. Breathing is unlabored. Heart: RRR with S1 S2. No murmurs, rubs, or gallops appreciated. Abdomen: Soft, non-tender, non-distended with normoactive bowel sounds. No hepatomegaly. No rebound/guarding. No obvious abdominal masses. Msk:  Strength and tone appear normal for age. Extremities: No clubbing or cyanosis. No edema. Distal pedal pulses are 2+ and equal bilaterally. Neuro: Alert and oriented X 3. No facial asymmetry. No focal deficit. Moves all extremities spontaneously. Psych:  Responds to questions appropriately with a normal affect.    Assessment and Plan:  Principal Problem:   Hypertensive emergency Active Problems:   Chest pain with moderate risk for cardiac etiology   HLD (hyperlipidemia)    Aortic atherosclerosis (HCC)   Tobacco abuse    1. Hypertensive emergency: -BP improving this morning -He reports full compliance with his BP medications  -Increase lisinopril to 10 mg daily with further daily titration as needed -Plan to wean clonidine moving forward -Continue Lopressor 50 gm bid -Evaluate for secondary hypertension with renal artery ultrasound, AM cortisol on 7/20, renin- aldosterone ratio this morning prior to getting ACEi, 24-hour urinary catecholamines and metanephrines  -Check TSH  2. Chest pain with moderate risk of cardiac etiology: -Currently chest pain free -Has ruled out -Schedule for Lexiscan Myoview this morning to evaluate for high-risk ischemia -  ASA -Check TTE -Metoprolol as above  3. Headache: -MRI brain negative for stroke -Likely in the setting of #1 -Currently pain free  4. HLD/aortic atherosclerosis: -Add Lipitor as above -Recheck lipid and liver function in ~ 6-8 weeks  5. Tobacco abuse: -Cessation is advised   6. Hyperglycemia: -A1c pending   Signed, Eula Listen, PA-C [facility] Pager: [phone] 11/26/2016, 7:36 AM

## 2016-11-26 NOTE — Progress Notes (Signed)
Patient off the floor to radiology and stress test

## 2016-11-26 NOTE — Care Management (Signed)
CM Screen as patient does not have insurance.  his wife receives disability.  Patient has not worked in the past 2 months.  Recently moved here with his wife from [place]. Has found assistance through [facility] [place]  [phone] for medical care and resources for homeless because they first arrived in [place] they were living out of a tent.  Patient was hired as a temp at the [employer] and became unable to work due to health issues.  He has filed for disability and it is in "the hearing stage."  [facility]oke with [facility] and since patient will be [place] cardi[place], agency can help with medications.  Instructed patient and his wife on completion of application and the need to keep follow up appointments.  Faxed the prescriptions to the clinic and sent wife to pick them up prior to clinic closing.  Updated primary nurse

## 2016-11-26 NOTE — Progress Notes (Signed)
[employer] [facility] [place]   PATIENT NAME: Kyle Olsen    MR#:  [id number]  DATE OF BIRTH:  [birth year]  SUBJECTIVE:  CHIEF COMPLAINT:   Chief Complaint  Patient presents with  . Hypertension  . Headache  . Numbness   - worried about persistent hypertension. - No chest pain now. For myoview today, renal US pending  REVIEW OF SYSTEMS:  Review of Systems  Constitutional: Negative for chills, fever, malaise/fatigue and weight loss.  HENT: Negative for ear discharge, ear pain, hearing loss and nosebleeds.   Eyes: Negative for blurred vision, double vision and photophobia.  Respiratory: Negative for cough, hemoptysis, shortness of breath and wheezing.   Cardiovascular: Positive for chest pain. Negative for palpitations, orthopnea and leg swelling.  Gastrointestinal: Negative for abdominal pain, constipation, diarrhea, heartburn, melena, nausea and vomiting.  Genitourinary: Negative for dysuria, frequency and urgency.  Musculoskeletal: Negative for back pain, myalgias and neck pain.  Skin: Negative for rash.  Neurological: Negative for dizziness, tingling, tremors, sensory change, speech change, focal weakness and headaches.  Endo/Heme/Allergies: Does not bruise/bleed easily.  Psychiatric/Behavioral: Negative for depression.    DRUG ALLERGIES:  No Known Allergies  VITALS:  Blood pressure (!) 173/97, pulse 65, temperature 97.9 F (36.6 C), temperature source Oral, resp. rate 20, height 5\' 7"  (1.702 m), weight 79.1 kg (174 lb 4.8 oz), SpO2 97 %.  PHYSICAL EXAMINATION:  Physical Exam  GENERAL:  56 y.o.-year-old patient lying in the bed with no acute distress.  EYES: Pupils equal, round, reactive to light and accommodation. No scleral icterus. Extraocular muscles intact. Right eye esotropia HEENT: Head atraumatic, normocephalic. Oropharynx and nasopharynx clear.  NECK:  Supple, no jugular venous distention. No thyroid enlargement, no tenderness.    LUNGS: Normal breath sounds bilaterally, no wheezing, rales,rhonchi or crepitation. No use of accessory muscles of respiration.  CARDIOVASCULAR: S1, S2 normal. No murmurs, rubs, or gallops.  ABDOMEN: Soft, nontender, nondistended. Bowel sounds present. No organomegaly or mass.  EXTREMITIES: No pedal edema, cyanosis, or clubbing.  NEUROLOGIC: Cranial nerves II through XII are intact. Muscle strength 5/5 in all extremities. Sensation intact. Gait not checked.  PSYCHIATRIC: The patient is alert and oriented x 3.  SKIN: No obvious rash, lesion, or ulcer.    LABORATORY PANEL:   CBC  Recent Labs Lab 11/26/16 0629  WBC 7.5  HGB 17.5  HCT 51.6  PLT 234   ------------------------------------------------------------------------------------------------------------------  Chemistries   Recent Labs Lab 11/25/16 1825 11/26/16 0629  NA 139 139  K 3.6 4.2  CL 106 106  CO2 25 25  GLUCOSE 109* 97  BUN 13 14  CREATININE 0.95 1.04  CALCIUM 9.2 9.3  AST 26  --   ALT 25  --   ALKPHOS 53  --   BILITOT 1.0  --    ------------------------------------------------------------------------------------------------------------------  Cardiac Enzymes  Recent Labs Lab 11/26/16 0629  TROPONINI <0.03   ------------------------------------------------------------------------------------------------------------------  RADIOLOGY:  Mr Brain Wo Contrast (neuro Protocol)  Result Date: 11/25/2016 CLINICAL DATA:  New headache, left arm weakness.  Hypertension EXAM: MRI HEAD WITHOUT CONTRAST TECHNIQUE: Multiplanar, multiecho pulse sequences of the brain and surrounding structures were obtained without intravenous contrast. COMPARISON:  CT head 11/23/2016 FINDINGS: Brain: Diffusion imaging degraded by artifact from metal in the region of the right orbit. Allowing for this, no acute infarct identified. Ventricle size normal. Negative for hemorrhage or mass. Scattered small hyperintensities in the frontal  white matter bilaterally. Vascular: Normal arterial flow void Skull and upper cervical  spine: Negative Sinuses/Orbits: Obscured by artifact Other: None IMPRESSION: No acute abnormality. Image quality degraded by artifact from dental work. Electronically Signed   By: Marlan Palauharles  Clark M.D.   On: 11/25/2016 19:38    EKG:   Orders placed or performed in visit on 11/25/16  . EKG 12-Lead    ASSESSMENT AND PLAN:   56 year old male with past medical history significant for hypertension and hyperlipidemia presents to hospital secondary to worsening chest pain and headache to have hypertensive urgency with systolic blood pressure greater than 200.   #1 Hypertensive urgency-Admitted for hypertensive urgency. Symptoms have resolved. -Medications have been adjusted. Findings included. -Started on Coreg, lisinopril and Norvasc. They can be titrated again as outpatient. -If stress test is negative, can be discharged home  #2 Chest pain-Secondary to hypertensive urgency, stable angina. -Troponins are negative. Appreciate cardiology consult. -Going for stress test today  #3 Headache-Likely secondary to hypotension. Resolved now. MRI of the brain is negative  #4 Hyperlipidemia-since LDL greater than 160, will add statin  #5 DVT prophylaxis-subcutaneous heparin  #6 tobacco use disorder-on nicotine patch    All the records are reviewed and case discussed with Care Management/Social Workerr. Management plans discussed with the patient, family and they are in agreement.  CODE STATUS: Full Code  TOTAL TIME TAKING CARE OF THIS PATIENT: 38 minutes.   POSSIBLE D/C TODAY, DEPENDING ON CLINICAL CONDITION.   Enid BaasKALISETTI,Carlas Vandyne M.D on 11/26/2016 at 9:10 AM  Between 7am to 6pm - Pager - [phone]  After 6pm go to [email or web address] - Social research officer, governmentpassword EPAS [facility]  [facility]  Office  [phone]  CC: Primary care physician; Hilbert CorriganPlacey, Chales AbrahamsMary Ann, NP

## 2016-11-26 NOTE — Progress Notes (Signed)
Called lab to request container  for  24 hr urine collect but I was informed that they don't have the container and they have to order it from [facility], and that I will be notified as soon as they get it .  

## 2016-11-26 NOTE — Progress Notes (Signed)
*  PRELIMINARY RESULTS* Echocardiogram 2D Echocardiogram has been performed.  Kyle Olsen, Kyle Olsen 11/26/2016, 12:06 PM

## 2016-11-26 NOTE — Progress Notes (Signed)
Patient returned to the floor from stree test at this time .

## 2016-11-26 NOTE — Progress Notes (Signed)
Called lab to request container  for  24 hr urine collect but I was informed that they don't have the container and they have to order it from [facility], and that I will be notified as soon as they get it .

## 2016-11-27 ENCOUNTER — Telehealth: Payer: Self-pay | Admitting: Internal Medicine

## 2016-11-27 ENCOUNTER — Encounter: Payer: Self-pay | Admitting: Physician Assistant

## 2016-11-27 LAB — HIV ANTIBODY (ROUTINE TESTING W REFLEX): HIV Screen 4th Generation wRfx: NONREACTIVE

## 2016-11-27 LAB — HEMOGLOBIN A1C
Hgb A1c MFr Bld: 5.7 % — ABNORMAL HIGH (ref 4.8–5.6)
MEAN PLASMA GLUCOSE: 117 mg/dL

## 2016-11-27 NOTE — Progress Notes (Signed)
Was notified of patient's abnormal renal artery ultrasound after his discharge. Case was discussed with Dr. Kirke CorinArida. Patient will be added to Dr. Jari SportsmanArida's schedule for further evaluation and management.

## 2016-11-27 NOTE — Telephone Encounter (Signed)
Pt wife calling stating pt is not feeling well  He is been sick all night States he's been throwing up and just nauseated  Please advise.

## 2016-11-27 NOTE — Telephone Encounter (Signed)
Symptoms not cardiac in nature. He was just hospitalized for uncontrolled hypertension, left-sided weakness, and chest pain. Stress test, echo, and brain MRI did not show any significant abnormalities. I recommend that he treat his symptoms conservatively with rest and clear liquids. If his symptoms worsen or do not improve, he should contact his PCP for further evaluation. Thanks.  Yvonne Kendallhristopher Derell Bruun, MD [facility] Pager: [phone]

## 2016-11-27 NOTE — Progress Notes (Signed)
Renal artery ultrasound ordered during this admission for uncontrolled hypertension and revealed severe right renal artery stenosis at the origin with some decrease in the right renal kidney size. -Unable to reach patient's primary care physician. -Discussed with Kyle Olsen from [facility]- they will call patient and arrange a vascular follow-up.

## 2016-11-27 NOTE — Telephone Encounter (Signed)
Reviewed recommendations w/pt who is agreeable w/plan.  

## 2016-11-27 NOTE — Telephone Encounter (Signed)
Calling restrictions have prevented the call from going through.

## 2016-11-27 NOTE — Telephone Encounter (Signed)
S/w with patient who reports nausea and vomiting with "a little" diarrhea for the past two hours. BP 152/108, HR 65 He states sx began approximately two hours ago. Described emesis as clear liquid.  No blood in stool or emesis per pt.  He and his wife had dinner last evening then went to bed. Wife is asymptomatic.  Denies any other sx, no chest pain. Pt was taken to the ED from his 7/17 OV with Dr. Okey DupreEnd for hypertensive emergency. Pt was admitted. Labs, lexi scan, renal US, MR brain and echo and was discharged home. New medications added during admission: aspirin 81mg , atorvastatin 40mg , amlodipine 5mg , coreg 12.5mg  BID and lisinopril 20mg  but has not taking morning doses d/t sx. Advised pt to have sips of clear liquids and ice chips and try to take morning medications to help w/BP control.  Will route to Dr. Okey DupreEnd for further review and to advise concerning virus vs medication reaction.

## 2016-11-27 NOTE — Discharge Summary (Signed)
[employer] - [place] at [facility]   PATIENT NAME: Kyle Olsen    MR#:  [id number]  DATE OF BIRTH:  02-Feb-1961  DATE OF ADMISSION:  11/25/2016   ADMITTING PHYSICIAN: Altamese Dilling, MD  DATE OF DISCHARGE: 11/26/2016  5:59 PM  PRIMARY CARE PHYSICIAN: Placey, Chales Abrahams, NP   ADMISSION DIAGNOSIS:   Unstable angina (HCC) [I20.0]  DISCHARGE DIAGNOSIS:   Principal Problem:   Hypertensive emergency Active Problems:   Aortic atherosclerosis (HCC)   Tobacco abuse   Chest pain with moderate risk for cardiac etiology   HLD (hyperlipidemia)   SECONDARY DIAGNOSIS:   Past Medical History:  Diagnosis Date  . Diverticulitis   . GI bleed   . HLD (hyperlipidemia)   . Hypertension     HOSPITAL COURSE:   56 year old male with past medical history significant for hypertension and hyperlipidemia presents to hospital secondary to worsening chest pain and headache to have hypertensive urgency with systolic blood pressure greater than 200.   #1 Hypertensive urgency-Admitted for hypertensive urgency. Symptoms have resolved. -Medications have been adjusted. . -Started on Coreg, lisinopril and Norvasc. They can be titrated again as outpatient. -Stress test is negative. clonidine has been discontinued -Renal ultrasound done for any renal artery stenosis and the results are pending at the time of discharge  #2 Chest pain-Secondary to hypertensive urgency, stable angina. -Troponins are negative. Appreciate cardiology consult. -Stress test is negative.  #3 Headache-Likely secondary to hypertension. Resolved now. MRI of the brain is negative  #4 Hyperlipidemia-since LDL greater than 160, added statin  #5 tobacco use disorder-on nicotine patch in the hospital. Counseled against smoking  DISCHARGE CONDITIONS:   Guarded CONSULTS OBTAINED:   Treatment Team:  Yvonne Kendall, MD Iran Ouch, MD  DRUG ALLERGIES:   No Known Allergies DISCHARGE  MEDICATIONS:   Allergies as of 11/26/2016   No Known Allergies     Medication List    STOP taking these medications   cloNIDine 0.1 MG tablet Commonly known as:  CATAPRES   metoprolol tartrate 50 MG tablet Commonly known as:  LOPRESSOR     TAKE these medications   amLODipine 5 MG tablet Commonly known as:  NORVASC Take 1 tablet (5 mg total) by mouth daily.   aspirin 81 MG chewable tablet Chew 1 tablet (81 mg total) by mouth daily.   atorvastatin 40 MG tablet Commonly known as:  LIPITOR Take 1 tablet (40 mg total) by mouth daily at 6 PM.   carvedilol 12.5 MG tablet Commonly known as:  COREG Take 1 tablet (12.5 mg total) by mouth 2 (two) times daily with a meal.   cyclobenzaprine 10 MG tablet Commonly known as:  FLEXERIL Take 10 mg by mouth 3 (three) times daily as needed for muscle spasms.   gabapentin 300 MG capsule Commonly known as:  NEURONTIN Take 300 mg by mouth 2 (two) times daily.   lisinopril 20 MG tablet Commonly known as:  PRINIVIL,ZESTRIL Take 1 tablet (20 mg total) by mouth daily.        DISCHARGE INSTRUCTIONS:   1. PCP follow-up in 1-2 weeks  DIET:   Cardiac diet  ACTIVITY:   Activity as tolerated  OXYGEN:   Home Oxygen: No.  Oxygen Delivery: room air  DISCHARGE LOCATION:   home   If you experience worsening of your admission symptoms, develop shortness of breath, life threatening emergency, suicidal or homicidal thoughts you must seek medical attention immediately by calling 911 or calling your MD immediately  if symptoms less severe.  You Must read complete instructions/literature along with all the possible adverse reactions/side effects for all the Medicines you take and that have been prescribed to you. Take any new Medicines after you have completely understood and accpet all the possible adverse reactions/side effects.   Please note  You were cared for by a hospitalist during your hospital stay. If you have any questions  about your discharge medications or the care you received while you were in the hospital after you are discharged, you can call the unit and asked to speak with the hospitalist on call if the hospitalist that took care of you is not available. Once you are discharged, your primary care physician will handle any further medical issues. Please note that NO REFILLS for any discharge medications will be authorized once you are discharged, as it is imperative that you return to your primary care physician (or establish a relationship with a primary care physician if you do not have one) for your aftercare needs so that they can reassess your need for medications and monitor your lab values.    On the day of Discharge:  VITAL SIGNS:   Blood pressure 131/87, pulse 75, temperature 98.2 F (36.8 C), temperature source Oral, resp. rate 20, height 5\' 7"  (1.702 m), weight 79.1 kg (174 lb 4.8 oz), SpO2 96 %.  PHYSICAL EXAMINATION:   GENERAL:  56 y.o.-year-old patient lying in the bed with no acute distress.  EYES: Pupils equal, round, reactive to light and accommodation. No scleral icterus. Extraocular muscles intact. Right eye esotropia HEENT: Head atraumatic, normocephalic. Oropharynx and nasopharynx clear.  NECK:  Supple, no jugular venous distention. No thyroid enlargement, no tenderness.  LUNGS: Normal breath sounds bilaterally, no wheezing, rales,rhonchi or crepitation. No use of accessory muscles of respiration.  CARDIOVASCULAR: S1, S2 normal. No murmurs, rubs, or gallops.  ABDOMEN: Soft, nontender, nondistended. Bowel sounds present. No organomegaly or mass.  EXTREMITIES: No pedal edema, cyanosis, or clubbing.  NEUROLOGIC: Cranial nerves II through XII are intact. Muscle strength 5/5 in all extremities. Sensation intact. Gait not checked.  PSYCHIATRIC: The patient is alert and oriented x 3.  SKIN: No obvious rash, lesion, or ulcer. Marland Kitchen   DATA REVIEW:   CBC  Recent Labs Lab 11/26/16 0629  WBC  7.5  HGB 17.5  HCT 51.6  PLT 234    Chemistries   Recent Labs Lab 11/25/16 1825 11/26/16 0629  NA 139 139  K 3.6 4.2  CL 106 106  CO2 25 25  GLUCOSE 109* 97  BUN 13 14  CREATININE 0.95 1.04  CALCIUM 9.2 9.3  AST 26  --   ALT 25  --   ALKPHOS 53  --   BILITOT 1.0  --      Microbiology Results  No results found for this or any previous visit.  RADIOLOGY:  No results found.   Management plans discussed with the patient, family and they are in agreement.  CODE STATUS:  Code Status History    Date Active Date Inactive Code Status Order ID Comments User Context   11/25/2016 10:02 PM 11/26/2016  9:04 PM Full Code [id number]  Altamese Dilling, MD Inpatient      TOTAL TIME TAKING CARE OF THIS PATIENT:  37 minutes.    Lonzy Mato M.D on 11/27/2016 at 4:00 PM  Between 7am to 6pm - Pager - [phone]  After 6pm go to [email or web address] - Social research officer, government  [employer]  [phone]  CC: Primary care physician; Placey, Chales AbrahamsMary Ann, NP   Note: This dictation was prepared with Dragon dictation along with smaller phrase technology. Any transcriptional errors that result from this process are unintentional.

## 2016-11-27 NOTE — Progress Notes (Signed)
Attempted to call patient to notify him of abnormal renal artery ultrasound and to notify him of there was no answer on his phone and no option to leave a voicemail. 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 This may be playing a role in his blood pressure. We would like for him to see Dr. Kirke CorinArida for further evaluation and treatment. This appointment has been scheduled for August 6 at 3:40 PM.

## 2016-11-27 NOTE — Telephone Encounter (Signed)
I called the patient for an update. He reports feeling a little better but "not how I should be". Slight headache but not as severe as when he was in the office. He now has chills. Unsure if he has a fever as he has no thermometer. He took amlodipine, coreg and lisinopril around 10am after we spoke but vomited about 30 minutes later.  Last vomited 30-40 minutes ago. He has eat a few Townhouse crackers and is sipping water.  Recheck VS while on the phone: BP 142/100 HR 76. Informed pt sx may be viral. He will continue to keep a close watch on BP.

## 2016-11-30 ENCOUNTER — Telehealth: Payer: Self-pay | Admitting: Internal Medicine

## 2016-11-30 NOTE — Telephone Encounter (Signed)
S/w pt who is agreeable to 8/6, 3:40pm appt w/Dr. Kirke CorinArida for renal artery stenosis.

## 2016-12-02 LAB — ALDOSTERONE + RENIN ACTIVITY W/ RATIO
ALDO / PRA RATIO: 35.7 — AB (ref 0.0–30.0)
ALDOSTERONE: 6 ng/dL (ref 0.0–30.0)
PRA LC/MS/MS: 0.168 ng/mL/h (ref 0.167–5.380)

## 2016-12-14 ENCOUNTER — Telehealth: Payer: Self-pay | Admitting: Cardiovascular Disease

## 2016-12-14 ENCOUNTER — Ambulatory Visit (INDEPENDENT_AMBULATORY_CARE_PROVIDER_SITE_OTHER): Payer: Self-pay | Admitting: Cardiovascular Disease

## 2016-12-14 ENCOUNTER — Encounter: Payer: Self-pay | Admitting: Cardiovascular Disease

## 2016-12-14 VITALS — BP 108/80 | HR 89 | Ht 67.0 in | Wt 177.0 lb

## 2016-12-14 DIAGNOSIS — N2889 Other specified disorders of kidney and ureter: Secondary | ICD-10-CM

## 2016-12-14 DIAGNOSIS — I10 Essential (primary) hypertension: Secondary | ICD-10-CM

## 2016-12-14 DIAGNOSIS — E785 Hyperlipidemia, unspecified: Secondary | ICD-10-CM

## 2016-12-14 DIAGNOSIS — Z720 Tobacco use: Secondary | ICD-10-CM

## 2016-12-14 DIAGNOSIS — I701 Atherosclerosis of renal artery: Secondary | ICD-10-CM

## 2016-12-14 DIAGNOSIS — N269 Renal sclerosis, unspecified: Secondary | ICD-10-CM

## 2016-12-14 NOTE — Patient Instructions (Addendum)
Medication Instructions:  Your physician recommends that you continue on your current medications as directed. Please refer to the Current Medication list given to you today.   Labwork: none  Testing/Procedures: CTA of the renal artery Nothing by mouth 4 hours prior to procedure  Tuesday, August 14 Arrival time 8:40am  [facility] [address] [facility] [address] [place]  [phone]     Follow-Up: Your physician recommends that you schedule a follow-up appointment in: 3 months with Dr. Kirke Corin.    Any Other Special Instructions Will Be Listed Below (If Applicable).      If you need a refill on your cardiac medications before your next appointment, please call your pharmacy.   Coping with Quitting Smoking Quitting smoking is a physical and mental challenge. You will face cravings, withdrawal symptoms, and temptation. Before quitting, work with your health care provider to make a plan that can help you cope. Preparation can help you quit and keep you from giving in. How can I cope with cravings? Cravings usually last for 5-10 minutes. If you get through it, the craving will pass. Consider taking the following actions to help you cope with cravings:  Keep your mouth busy: ? Chew sugar-free gum. ? Suck on hard candies or a straw. ? Brush your teeth.  Keep your hands and body busy: ? Immediately change to a different activity when you feel a craving. ? Squeeze or play with a ball. ? Do an activity or a hobby, like making bead jewelry, practicing needlepoint, or working with wood. ? Mix up your normal routine. ? Take a short exercise break. Go for a quick walk or run up and down stairs. ? Spend time in public places where smoking is not allowed.  Focus on doing something kind or helpful for someone else.  Call a friend or family member to talk during a craving.  Join a support group.  Call a quit line, such as  1-800-QUIT-NOW.  Talk with your health care provider about medicines that might help you cope with cravings and make quitting easier for you.  How can I deal with withdrawal symptoms? Your body may experience negative effects as it tries to get used to not having nicotine in the system. These effects are called withdrawal symptoms. They may include:  Feeling hungrier than normal.  Trouble concentrating.  Irritability.  Trouble sleeping.  Feeling depressed.  Restlessness and agitation.  Craving a cigarette.  To manage withdrawal symptoms:  Avoid places, people, and activities that trigger your cravings.  Remember why you want to quit.  Get plenty of sleep.  Avoid coffee and other caffeinated drinks. These may worsen some of your symptoms.  How can I handle social situations? Social situations can be difficult when you are quitting smoking, especially in the first few weeks. To manage this, you can:  Avoid parties, bars, and other social situations where people might be smoking.  Avoid alcohol.  Leave right away if you have the urge to smoke.  Explain to your family and friends that you are quitting smoking. Ask for understanding and support.  Plan activities with friends or family where smoking is not an option.  What are some ways I can cope with stress? Wanting to smoke may cause stress, and stress can make you want to smoke. Find ways to manage your stress. Relaxation techniques can help. For example:  Breathe slowly and deeply, in through your nose and out through your mouth.  Listen to  soothing, relaxing music.  Talk with a family member or friend about your stress.  Light a candle.  Soak in a bath or take a shower.  Think about a peaceful place.  What are some ways I can prevent weight gain? Be aware that many people gain weight after they quit smoking. However, not everyone does. To keep from gaining weight, have a plan in place before you quit and  stick to the plan after you quit. Your plan should include:  Having healthy snacks. When you have a craving, it may help to: ? Eat plain popcorn, crunchy carrots, celery, or other cut vegetables. ? Chew sugar-free gum.  Changing how you eat: ? Eat small portion sizes at meals. ? Eat 4-6 small meals throughout the day instead of 1-2 large meals a day. ? Be mindful when you eat. Do not watch television or do other things that might distract you as you eat.  Exercising regularly: ? Make time to exercise each day. If you do not have time for a long workout, do short bouts of exercise for 5-10 minutes several times a day. ? Do some form of strengthening exercise, like weight lifting, and some form of aerobic exercise, like running or swimming.  Drinking plenty of water or other low-calorie or no-calorie drinks. Drink 6-8 glasses of water daily, or as much as instructed by your health care provider.  Summary  Quitting smoking is a physical and mental challenge. You will face cravings, withdrawal symptoms, and temptation to smoke again. Preparation can help you as you go through these challenges.  You can cope with cravings by keeping your mouth busy (such as by chewing gum), keeping your body and hands busy, and making calls to family, friends, or a helpline for people who want to quit smoking.  You can cope with withdrawal symptoms by avoiding places where people smoke, avoiding drinks with caffeine, and getting plenty of rest.  Ask your health care provider about the different ways to prevent weight gain, avoid stress, and handle social situations. This information is not intended to replace advice given to you by your health care provider. Make sure you discuss any questions you have with your health care provider. Document Released: 04/24/2016 Document Revised: 04/24/2016 Document Reviewed: 04/24/2016 Elsevier Interactive Patient Education  2018 [employer].  Steps to Quit  Smoking Smoking tobacco can be bad for your health. It can also affect almost every organ in your body. Smoking puts you and people around you at risk for many serious long-lasting (chronic) diseases. Quitting smoking is hard, but it is one of the best things that you can do for your health. It is never too late to quit. What are the benefits of quitting smoking? When you quit smoking, you lower your risk for getting serious diseases and conditions. They can include:  Lung cancer or lung disease.  Heart disease.  Stroke.  Heart attack.  Not being able to have children (infertility).  Weak bones (osteoporosis) and broken bones (fractures).  If you have coughing, wheezing, and shortness of breath, those symptoms may get better when you quit. You may also get sick less often. If you are pregnant, quitting smoking can help to lower your chances of having a baby of low birth weight. What can I do to help me quit smoking? Talk with your doctor about what can help you quit smoking. Some things you can do (strategies) include:  Quitting smoking totally, instead of slowly cutting back how much you  smoke over a period of time.  Going to in-person counseling. You are more likely to quit if you go to many counseling sessions.  Using resources and support systems, such as: ? Agricultural engineernline chats with a Veterinary surgeoncounselor. ? Phone quitlines. ? Automotive engineerrinted self-help materials. ? Support groups or group counseling. ? Text messaging programs. ? Mobile phone apps or applications.  Taking medicines. Some of these medicines may have nicotine in them. If you are pregnant or breastfeeding, do not take any medicines to quit smoking unless your doctor says it is okay. Talk with your doctor about counseling or other things that can help you.  Talk with your doctor about using more than one strategy at the same time, such as taking medicines while you are also going to in-person counseling. This can help make quitting  easier. What things can I do to make it easier to quit? Quitting smoking might feel very hard at first, but there is a lot that you can do to make it easier. Take these steps:  Talk to your family and friends. Ask them to support and encourage you.  Call phone quitlines, reach out to support groups, or work with a Veterinary surgeoncounselor.  Ask people who smoke to not smoke around you.  Avoid places that make you want (trigger) to smoke, such as: ? Bars. ? Parties. ? Smoke-break areas at work.  Spend time with people who do not smoke.  Lower the stress in your life. Stress can make you want to smoke. Try these things to help your stress: ? Getting regular exercise. ? Deep-breathing exercises. ? Yoga. ? Meditating. ? Doing a body scan. To do this, close your eyes, focus on one area of your body at a time from head to toe, and notice which parts of your body are tense. Try to relax the muscles in those areas.  Download or buy apps on your mobile phone or tablet that can help you stick to your quit plan. There are many free apps, such as QuitGuide from the [employer] developer(Centers for Disease Control and Prevention). You can find more support from smokefree.gov and other websites.  This information is not intended to replace advice given to you by your health care provider. Make sure you discuss any questions you have with your health care provider. Document Released: 02/21/2009 Document Revised: 12/24/2015 Document Reviewed: 09/11/2014 Elsevier Interactive Patient Education  2018 [employer]

## 2016-12-14 NOTE — Telephone Encounter (Signed)
S/w [facility] department who provided order information for CTA renal artery for stenosis and right kidney mass.

## 2016-12-14 NOTE — Progress Notes (Signed)
Cardiology Office Note   Date:  12/14/2016   ID:  Kyle Olsen, DOB 1961-04-01, MRN [id number]  PCP:  Lavinia Sharps, NP  Cardiologist:   Lorine Bears, MD   Chief Complaint  Patient presents with  . other    Follow up Renal u/s, myoview & Echo. Meds reviewed by the pt. verbally. Pt. c/o lower right back pain.       History of Present Illness: Kyle Olsen is a 56 y.o. male who presents for a follow-up visit after recent hospitalization for hypertensive urgency. He has no prior cardiac history. He presented recently to [facility] with headache, chest pain and shortness of breath. He was found to have significantly elevated blood pressure. He was taking metoprolol and clonidine for hypertension and has moved recently from [place]. Cardiac enzymes were negative. Metoprolol was switched to carvedilol and clonidine was discontinued. He was also started on amlodipine and lisinopril. He had an echocardiogram done which showed normal LV systolic function with grade 1 diastolic dysfunction. Nuclear stress test showed no evidence of ischemia with normal ejection fraction. Renal artery duplex was suggestive of significant right renal artery stenosis. Peak velocity was 242 with renal to aortic ratio of 4.2. There was questionable finding of a solid mass in the interpolar right kidney with recommendations for further imaging with CT or MRI. Other workup for secondary hypertension was negative except for elevated aldosterone to Renin ratio at 35.7. He has been doing well with no chest pain, shortness of breath or palpitations. His blood pressure has been controlled on current medications and he feels better overall. He continues to smoke one pack per day. He complains of right sided back pain.  Past Medical History:  Diagnosis Date  . Diverticulitis   . GI bleed   . HLD (hyperlipidemia)   . Hypertension     Past Surgical History:  Procedure Laterality Date  . APPENDECTOMY    . TONSILLECTOMY        Current Outpatient Prescriptions  Medication Sig Dispense Refill  . amLODipine (NORVASC) 5 MG tablet Take 1 tablet (5 mg total) by mouth daily. 30 tablet 2  . aspirin 81 MG chewable tablet Chew 1 tablet (81 mg total) by mouth daily. 30 tablet 2  . atorvastatin (LIPITOR) 40 MG tablet Take 1 tablet (40 mg total) by mouth daily at 6 PM. 30 tablet 2  . carvedilol (COREG) 12.5 MG tablet Take 1 tablet (12.5 mg total) by mouth 2 (two) times daily with a meal. 60 tablet 2  . cyclobenzaprine (FLEXERIL) 10 MG tablet Take 10 mg by mouth 3 (three) times daily as needed for muscle spasms.    Marland Kitchen gabapentin (NEURONTIN) 300 MG capsule Take 300 mg by mouth 2 (two) times daily.     Marland Kitchen lisinopril (PRINIVIL,ZESTRIL) 20 MG tablet Take 1 tablet (20 mg total) by mouth daily. 30 tablet 2  . omeprazole (PRILOSEC) 20 MG capsule Take 20 mg by mouth daily.     No current facility-administered medications for this visit.     Allergies:   Patient has no known allergies.    Social History:  The patient  reports that he has been smoking Cigarettes.  He has a 40.00 pack-year smoking history. He has never used smokeless tobacco. He reports that he does not drink alcohol.   Family History:  The patient's family history includes Heart disease in his sister.    ROS:  Please see the history of present illness.   Otherwise, review of  systems are positive for none.   All other systems are reviewed and negative.    PHYSICAL EXAM: VS:  BP 108/80 (BP Location: Left Arm, Patient Position: Sitting, Cuff Size: Normal)   Pulse 89   Ht 5\' 7"  (1.702 m)   Wt 177 lb (80.3 kg)   BMI 27.72 kg/m  , BMI Body mass index is 27.72 kg/m. GEN: Well nourished, well developed, in no acute distress  HEENT: normal  Neck: no JVD, carotid bruits, or masses Cardiac: RRR; no murmurs, rubs, or gallops,no edema  Respiratory:  clear to auscultation bilaterally, normal work of breathing GI: soft, nontender, nondistended, + BS MS: no  deformity or atrophy  Skin: warm and dry, no rash Neuro:  Strength and sensation are intact Psych: euthymic mood, full affect   EKG:  EKG is not ordered today.   Recent Labs: 11/25/2016: ALT 25; B Natriuretic Peptide 19.0 11/26/2016: BUN 14; Creatinine, Ser 1.04; Hemoglobin 17.5; Platelets 234; Potassium 4.2; Sodium 139; TSH 3.334    Lipid Panel    Component Value Date/Time   CHOL 232 (H) 11/25/2016 2234   TRIG 179 (H) 11/25/2016 2234   HDL 34 (L) 11/25/2016 2234   CHOLHDL 6.8 11/25/2016 2234   VLDL 36 11/25/2016 2234   LDLCALC 162 (H) 11/25/2016 2234      Wt Readings from Last 3 Encounters:  12/14/16 177 lb (80.3 kg)  11/25/16 174 lb 4.8 oz (79.1 kg)  11/25/16 175 lb (79.4 kg)       No flowsheet data found.    ASSESSMENT AND PLAN:  1.  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 If blood pressure remains controlled, is no indication for revascularization.  2. Possible right renal mass: This will be further evaluated with CT imaging.  3. Hyperlipidemia: LDL was 162. He was started on atorvastatin. He will require follow-up lipid and liver profile.  4. Tobacco use: I discussed with him the importance of smoking cessation.  I also advised him to establish with a primary care physician.   Disposition:   FU with me in 3 months  Signed,  Lorine BearsMuhammad Arida, MD  12/14/2016 3:33 PM    [facility]

## 2016-12-22 ENCOUNTER — Ambulatory Visit
Admission: RE | Admit: 2016-12-22 | Discharge: 2016-12-22 | Disposition: A | Discharge status: Home / Self Care | Payer: Self-pay | Source: Ambulatory Visit | Attending: Cardiovascular Disease | Admitting: Cardiovascular Disease

## 2016-12-22 DIAGNOSIS — N200 Calculus of kidney: Secondary | ICD-10-CM | POA: Insufficient documentation

## 2016-12-22 DIAGNOSIS — I700 Atherosclerosis of aorta: Secondary | ICD-10-CM | POA: Insufficient documentation

## 2016-12-22 DIAGNOSIS — N2889 Other specified disorders of kidney and ureter: Secondary | ICD-10-CM | POA: Insufficient documentation

## 2016-12-22 DIAGNOSIS — N269 Renal sclerosis, unspecified: Secondary | ICD-10-CM | POA: Insufficient documentation

## 2016-12-22 DIAGNOSIS — K8020 Calculus of gallbladder without cholecystitis without obstruction: Secondary | ICD-10-CM | POA: Insufficient documentation

## 2016-12-22 DIAGNOSIS — I701 Atherosclerosis of renal artery: Secondary | ICD-10-CM | POA: Insufficient documentation

## 2016-12-22 DIAGNOSIS — N1330 Unspecified hydronephrosis: Secondary | ICD-10-CM | POA: Insufficient documentation

## 2016-12-22 MED ORDER — IOPAMIDOL (ISOVUE-370) INJECTION 76%
100.0000 mL | Freq: Once | INTRAVENOUS | Status: AC | PRN
  Administered 2016-12-22: 100 mL via INTRAVENOUS

## 2016-12-23 ENCOUNTER — Ambulatory Visit: Payer: Self-pay | Admitting: Pharmacy Technician

## 2016-12-24 NOTE — Progress Notes (Signed)
Patient scheduled for [facility] appointment at [facility] on 12/23/16 at 2:00p.m.  Patient called to cancel appointment.  Patient did not request another appointment.  Will reschedule at a later date.  [facility] unable to provide additional medication assistance until eligibility is determined.  Sherilyn DacostaBetty J. Prescious Hurless Care Manager [facility]

## 2016-12-29 ENCOUNTER — Emergency Department: Payer: Self-pay

## 2016-12-29 ENCOUNTER — Encounter: Payer: Self-pay | Admitting: *Deleted

## 2016-12-29 ENCOUNTER — Emergency Department
Admission: EM | Admit: 2016-12-29 | Discharge: 2016-12-29 | Disposition: A | Discharge status: Home / Self Care | Payer: Self-pay | Attending: Emergency Medicine | Admitting: Emergency Medicine

## 2016-12-29 DIAGNOSIS — Y929 Unspecified place or not applicable: Secondary | ICD-10-CM | POA: Insufficient documentation

## 2016-12-29 DIAGNOSIS — F17210 Nicotine dependence, cigarettes, uncomplicated: Secondary | ICD-10-CM | POA: Insufficient documentation

## 2016-12-29 DIAGNOSIS — Z79899 Other long term (current) drug therapy: Secondary | ICD-10-CM | POA: Insufficient documentation

## 2016-12-29 DIAGNOSIS — M79661 Pain in right lower leg: Secondary | ICD-10-CM | POA: Insufficient documentation

## 2016-12-29 DIAGNOSIS — I10 Essential (primary) hypertension: Secondary | ICD-10-CM | POA: Insufficient documentation

## 2016-12-29 DIAGNOSIS — M5431 Sciatica, right side: Secondary | ICD-10-CM | POA: Insufficient documentation

## 2016-12-29 DIAGNOSIS — Z7982 Long term (current) use of aspirin: Secondary | ICD-10-CM | POA: Insufficient documentation

## 2016-12-29 DIAGNOSIS — Y999 Unspecified external cause status: Secondary | ICD-10-CM | POA: Insufficient documentation

## 2016-12-29 DIAGNOSIS — Y939 Activity, unspecified: Secondary | ICD-10-CM | POA: Insufficient documentation

## 2016-12-29 DIAGNOSIS — X500XXA Overexertion from strenuous movement or load, initial encounter: Secondary | ICD-10-CM | POA: Insufficient documentation

## 2016-12-29 DIAGNOSIS — S39012A Strain of muscle, fascia and tendon of lower back, initial encounter: Secondary | ICD-10-CM | POA: Insufficient documentation

## 2016-12-29 MED ORDER — PREDNISONE 10 MG (21) PO TBPK: ORAL_TABLET | ORAL | 0 refills | Status: DC

## 2016-12-29 MED ORDER — CYCLOBENZAPRINE HCL 10 MG PO TABS: 10.0000 mg | ORAL_TABLET | Freq: Three times a day (TID) | ORAL | 0 refills | Status: AC | PRN

## 2016-12-29 MED ORDER — ORPHENADRINE CITRATE 30 MG/ML IJ SOLN
60.0000 mg | Freq: Two times a day (BID) | INTRAMUSCULAR | Status: DC
  Administered 2016-12-29: 60 mg via INTRAMUSCULAR
  Filled 2016-12-29: qty 2

## 2016-12-29 MED ORDER — DEXAMETHASONE SODIUM PHOSPHATE 10 MG/ML IJ SOLN
10.0000 mg | Freq: Once | INTRAMUSCULAR | Status: AC
  Administered 2016-12-29: 10 mg via INTRAMUSCULAR
  Filled 2016-12-29: qty 1

## 2016-12-29 NOTE — Discharge Instructions (Signed)
Take medication as prescribed. Return to emergency department if symptoms worsen and follow-up with PCP as needed.    If symptoms do not resolve follow up with orthopedic referral contact information above.  If symptoms significantly worsen do not hesitate to return to the emergency department.

## 2016-12-29 NOTE — ED Notes (Addendum)
Lower back pain, left and right, radiating to hips. Started yesterday. Denies numbness, tingling feet. Denies changes in bowel or bladder. Took 800 mg ibuprofen at 0630

## 2016-12-29 NOTE — ED Provider Notes (Signed)
[facility] Emergency Department Provider Note   ____________________________________________   I have reviewed the triage vital signs and the nursing notes.   HISTORY  Chief Complaint Back Pain    HPI Kyle Olsen is a 56 y.o. male presents to the emergency deparment with lumbar back pain and radiating pain down the right lower extremity that developed around 2:00 pm yesterday following a lifting injury. Patient reports lifting and twisting injury when he noted a pop in his back Following the injury, he experienced onset of the above symptoms which have not improved despite taking 800 mg ibuprofen. Patient reports last ibuprofen was at 6:30 this morning.Patient denies any past back injury or surgeries. Patient denies bowel or bladder dysfunction or saddle anesthesia. Patient denies sensation changes in the lower extremities or motor deficits in the lower extremities when standing or walking. Patient denies fever, chills, headache, vision changes, chest pain, chest tightness, shortness of breath, abdominal pain, nausea and vomiting.  Past Medical History:  Diagnosis Date  . Diverticulitis   . GI bleed   . HLD (hyperlipidemia)   . Hypertension     Patient Active Problem List   Diagnosis Date Noted  . Chest pain with moderate risk for cardiac etiology 11/26/2016  . HLD (hyperlipidemia) 11/26/2016  . Hypertensive emergency 11/25/2016  . Aortic atherosclerosis (HCC) 11/25/2016  . Tobacco abuse 11/25/2016    Past Surgical History:  Procedure Laterality Date  . APPENDECTOMY    . TONSILLECTOMY      Prior to Admission medications   Medication Sig Start Date End Date Taking? Authorizing Provider  amLODipine (NORVASC) 5 MG tablet Take 1 tablet (5 mg total) by mouth daily. 11/26/16   Enid Baas, MD  aspirin 81 MG chewable tablet Chew 1 tablet (81 mg total) by mouth daily. 11/26/16   Enid Baas, MD  atorvastatin (LIPITOR) 40 MG tablet Take 1  tablet (40 mg total) by mouth daily at 6 PM. 11/26/16   Enid Baas, MD  carvedilol (COREG) 12.5 MG tablet Take 1 tablet (12.5 mg total) by mouth 2 (two) times daily with a meal. 11/26/16   Enid Baas, MD  cyclobenzaprine (FLEXERIL) 10 MG tablet Take 1 tablet (10 mg total) by mouth 3 (three) times daily as needed for muscle spasms. 12/29/16   Little, Traci M, PA-C  gabapentin (NEURONTIN) 300 MG capsule Take 300 mg by mouth 2 (two) times daily.     [provider]  lisinopril (PRINIVIL,ZESTRIL) 20 MG tablet Take 1 tablet (20 mg total) by mouth daily. 11/26/16   Enid Baas, MD  omeprazole (PRILOSEC) 20 MG capsule Take 20 mg by mouth daily.    [provider]  predniSONE (STERAPRED UNI-PAK 21 TAB) 10 MG (21) TBPK tablet Take 6 tablets on day 1. Take 5 tablets on day 2. Take 4 tablets on day 3. Take 3 tablets on day 4. Take 2 tablets on day 5. Take 1 tablets on day 6. 12/29/16   Little, Traci M, PA-C    Allergies Patient has no known allergies.  Family History  Problem Relation Age of Onset  . Heart disease Sister     Social History Social History  Substance Use Topics  . Smoking status: Current Every Day Smoker    Packs/day: 1.00    Years: 40.00    Types: Cigarettes  . Smokeless tobacco: Never Used  . Alcohol use No     Comment: occasionally (once per month)    Review of Systems Constitutional: Negative for  fever/chills Eyes: No visual changes. ENT:  Negative for sore throat and for difficulty swallowing Cardiovascular: Denies chest pain. Respiratory: Denies cough. Denies shortness of breath. Gastrointestinal: No abdominal pain.  No nausea, vomiting, diarrhea. Genitourinary: Negative for dysuria. Musculoskeletal: Positive for lumbar back pain with pain radiating down right lower extremity.  Skin: Negative for rash. Neurological: Negative for headaches.  Negative focal weakness or numbness.  ____________________________________________   PHYSICAL EXAM:  VITAL SIGNS: ED Triage Vitals [12/29/16 0842]  Enc Vitals Group     BP      Pulse      Resp      Temp      Temp src      SpO2      Weight 177 lb (80.3 kg)     Height 5\' 7"  (1.702 m)     Head Circumference      Peak Flow      Pain Score 10     Pain Loc      Pain Edu?      Excl. in GC?     Constitutional: Alert and oriented. Well appearing and in no acute distress.  Eyes: Conjunctivae are normal. PERRL. EOMI  Head: Normocephalic and atraumatic. ENT: Ears:Canals clear. TMs intact bilaterally. Nose: No congestion/rhinnorhea. Mouth/Throat: Mucous membranes are moist.  Neck:Supple.  Cardiovascular: Normal rate, regular rhythm. Normal S1 and S2. Good peripheral circulation. Respiratory: Normal respiratory effort without tachypnea or retractions. Lungs CTAB. No wheezes/rales/rhonchi. Good air entry to the bases with no decreased or absent breath sounds. Hematological/Lymphatic/Immunological: No cervical lymphadenopathy. Cardiovascular: Normal rate, regular rhythm. Normal distal pulses. Gastrointestinal: Bowel sounds 4 quadrants. Soft and nontender to palpation. Musculoskeletal: Right side lumbar pain with pain radiating to the ankle. Lumbar spine range of motion intact with severe pain. Palpable tenderness along the L4 and L5 spinous processes without deformities noted. Bilateral lower extremity sensation and strength intact. Otherwise, nontender with normal range of motion in all extremities. Neurologic: Normal speech and language. No gross focal neurologic deficits are appreciated. No sensory loss or abnormal reflexes.  Skin:  Skin is warm, dry and intact. No rash noted. Psychiatric:Mood and affect are normal. Speech and behavior are normal. Patient exhibits appropriate insight and judgement. ____________________________________________   LABS (all labs ordered are listed, but only abnormal results  are displayed)  Labs Reviewed - No data to display ____________________________________________  EKG None ____________________________________________  RADIOLOGY DG lumbar spine complete FINDINGS: The lumbar vertebrae remain in normal alignment. Very mild degenerative disc disease at L2-3 and L3-4 is unchanged with slight loss of disc space and mild spurring. No acute compression deformity is seen. There is mild degenerative change of the facet joints of L4-5 and L5-S1. Moderate abdominal aortic atherosclerosis is noted. The SI joints appear corticated.  IMPRESSION: 1. Normal alignment with only mild degenerative disc disease at L2-3 and L3-4. No acute abnormality. 2. Moderate abdominal aortic atherosclerosis ____________________________________________   PROCEDURES  Procedure(s) performed: no    Critical Care performed: no ____________________________________________   INITIAL IMPRESSION / ASSESSMENT AND PLAN / ED COURSE  Pertinent labs & imaging results that were available during my care of the patient were reviewed by me and considered in my medical decision making (see chart for details).  Patient presents to emergency department with lumbar back pain and right lower extremity radicular pain. History, physical exam findings and imaging are reassuring of no acute fracture and symptoms are consistent with acute lumbar strain with associated inflammation and radicular pain. Patient will be prescribed a  prednisone taper for inflammation and Robaxin for muscle spasms as needed. Patient advised to follow up with PCP as needed or return to the emergency department if symptoms return or worsen.  ____________________________________________   FINAL CLINICAL IMPRESSION(S) / ED DIAGNOSES  Final diagnoses:  Strain of lumbar region, initial encounter  Sciatica of right side       NEW MEDICATIONS STARTED DURING THIS VISIT:  Discharge Medication List as of 12/29/2016   9:50 AM    START taking these medications   Details  predniSONE (STERAPRED UNI-PAK 21 TAB) 10 MG (21) TBPK tablet Take 6 tablets on day 1. Take 5 tablets on day 2. Take 4 tablets on day 3. Take 3 tablets on day 4. Take 2 tablets on day 5. Take 1 tablets on day 6., Print         Note:  This document was prepared using Dragon voice recognition software and may include unintentional dictation errors.    Clois Comber, PA-C 12/29/16 1610    Emily Filbert, MD 12/29/16 1057

## 2016-12-29 NOTE — ED Triage Notes (Signed)
States severe lower back pain, states it radiates down his legs, states he lifts heavy things at work, leaning over in wheelchair

## 2017-01-14 ENCOUNTER — Ambulatory Visit: Payer: Self-pay | Admitting: Cardiovascular Disease

## 2017-01-19 ENCOUNTER — Ambulatory Visit: Payer: Self-pay | Admitting: Gastroenterology

## 2017-02-02 ENCOUNTER — Telehealth: Payer: Self-pay | Admitting: Pharmacy Technician

## 2017-02-02 NOTE — Telephone Encounter (Signed)
Patient failed to provide current poi.  No additional medication assistance will be provided by [facility] without the required proof of income documentation.  Patient notified by letter.  Dymon Summerhill J. Virtie Bungert Care Manager [facility] 

## 2017-02-07 ENCOUNTER — Emergency Department: Payer: Worker's Compensation

## 2017-02-07 ENCOUNTER — Emergency Department
Admission: EM | Admit: 2017-02-07 | Discharge: 2017-02-07 | Disposition: A | Discharge status: Home / Self Care | Payer: Worker's Compensation | Attending: Emergency Medicine | Admitting: Emergency Medicine

## 2017-02-07 DIAGNOSIS — Z79899 Other long term (current) drug therapy: Secondary | ICD-10-CM | POA: Insufficient documentation

## 2017-02-07 DIAGNOSIS — F17210 Nicotine dependence, cigarettes, uncomplicated: Secondary | ICD-10-CM | POA: Insufficient documentation

## 2017-02-07 DIAGNOSIS — W19XXXA Unspecified fall, initial encounter: Secondary | ICD-10-CM

## 2017-02-07 DIAGNOSIS — I10 Essential (primary) hypertension: Secondary | ICD-10-CM | POA: Insufficient documentation

## 2017-02-07 DIAGNOSIS — S63501A Unspecified sprain of right wrist, initial encounter: Secondary | ICD-10-CM | POA: Insufficient documentation

## 2017-02-07 DIAGNOSIS — Y929 Unspecified place or not applicable: Secondary | ICD-10-CM | POA: Insufficient documentation

## 2017-02-07 DIAGNOSIS — Y939 Activity, unspecified: Secondary | ICD-10-CM | POA: Insufficient documentation

## 2017-02-07 DIAGNOSIS — Z7982 Long term (current) use of aspirin: Secondary | ICD-10-CM | POA: Insufficient documentation

## 2017-02-07 DIAGNOSIS — W010XXA Fall on same level from slipping, tripping and stumbling without subsequent striking against object, initial encounter: Secondary | ICD-10-CM | POA: Insufficient documentation

## 2017-02-07 DIAGNOSIS — Y990 Civilian activity done for income or pay: Secondary | ICD-10-CM | POA: Insufficient documentation

## 2017-02-07 DIAGNOSIS — S7002XA Contusion of left hip, initial encounter: Secondary | ICD-10-CM | POA: Insufficient documentation

## 2017-02-07 MED ORDER — NAPROXEN 500 MG PO TABS
500.0000 mg | ORAL_TABLET | Freq: Once | ORAL | Status: AC
  Administered 2017-02-07: 500 mg via ORAL
  Filled 2017-02-07: qty 1

## 2017-02-07 MED ORDER — NAPROXEN 500 MG PO TABS: 500.0000 mg | ORAL_TABLET | Freq: Two times a day (BID) | ORAL | 0 refills | Status: AC

## 2017-02-07 NOTE — ED Provider Notes (Signed)
[facility] Emergency Department Provider Note ____________________________________________  Time seen: 1511  I have reviewed the triage vital signs and the nursing notes.  HISTORY  Chief Complaint  Hip Pain and Wrist Pain  HPI Kyle Olsen is a 56 y.o. male Presents to the ED from work following a mechanical fall. Patient describes slipping in a freezer at work, and having pain to the right wrist and left hip following a fall. He denies any head injury, loss of consciousness, or lacerations. He is ambulatory to the ED for evaluation of his symptoms.  Past Medical History:  Diagnosis Date  . Diverticulitis   . GI bleed   . HLD (hyperlipidemia)   . Hypertension     Patient Active Problem List   Diagnosis Date Noted  . Chest pain with moderate risk for cardiac etiology 11/26/2016  . HLD (hyperlipidemia) 11/26/2016  . Hypertensive emergency 11/25/2016  . Aortic atherosclerosis (HCC) 11/25/2016  . Tobacco abuse 11/25/2016    Past Surgical History:  Procedure Laterality Date  . APPENDECTOMY    . TONSILLECTOMY      Prior to Admission medications   Medication Sig Start Date End Date Taking? Authorizing Provider  amLODipine (NORVASC) 5 MG tablet Take 1 tablet (5 mg total) by mouth daily. 11/26/16   Enid Baas, MD  aspirin 81 MG chewable tablet Chew 1 tablet (81 mg total) by mouth daily. 11/26/16   Enid Baas, MD  atorvastatin (LIPITOR) 40 MG tablet Take 1 tablet (40 mg total) by mouth daily at 6 PM. 11/26/16   Enid Baas, MD  carvedilol (COREG) 12.5 MG tablet Take 1 tablet (12.5 mg total) by mouth 2 (two) times daily with a meal. 11/26/16   Enid Baas, MD  cyclobenzaprine (FLEXERIL) 10 MG tablet Take 1 tablet (10 mg total) by mouth 3 (three) times daily as needed for muscle spasms. 12/29/16   Little, Traci M, PA-C  gabapentin (NEURONTIN) 300 MG capsule Take 300 mg by mouth 2 (two) times daily.     [provider]   lisinopril (PRINIVIL,ZESTRIL) 20 MG tablet Take 1 tablet (20 mg total) by mouth daily. 11/26/16   Enid Baas, MD  omeprazole (PRILOSEC) 20 MG capsule Take 20 mg by mouth daily.    [provider]  predniSONE (STERAPRED UNI-PAK 21 TAB) 10 MG (21) TBPK tablet Take 6 tablets on day 1. Take 5 tablets on day 2. Take 4 tablets on day 3. Take 3 tablets on day 4. Take 2 tablets on day 5. Take 1 tablets on day 6. 12/29/16   Little, Traci M, PA-C    Allergies Patient has no known allergies.  Family History  Problem Relation Age of Onset  . Heart disease Sister     Social History Social History  Substance Use Topics  . Smoking status: Current Every Day Smoker    Packs/day: 1.00    Years: 40.00    Types: Cigarettes  . Smokeless tobacco: Never Used  . Alcohol use No     Comment: occasionally (once per month)    Review of Systems  Constitutional: Negative for fever. Cardiovascular: Negative for chest pain. Respiratory: Negative for shortness of breath. Musculoskeletal: Negative for back pain. Right wrist and left hip pain as above. Skin: Negative for rash. Neurological: Negative for headaches, focal weakness or numbness. ____________________________________________  PHYSICAL EXAM:  VITAL SIGNS: ED Triage Vitals  Enc Vitals Group     BP 02/07/17 1435 112/71     Pulse Rate 02/07/17 1435 69  Resp 02/07/17 1435 18     Temp 02/07/17 1435 98.1 F (36.7 C)     Temp Source 02/07/17 1435 Oral     SpO2 02/07/17 1435 100 %     Weight 02/07/17 1435 175 lb (79.4 kg)     Height 02/07/17 1435  (1.702 m)     Head Circumference --      Peak Flow --      Pain Score 02/07/17 1447 10     Pain Loc --      Pain Edu? --      Excl. in GC? --     Constitutional: Alert and oriented. Well appearing and in no distress. Head: Normocephalic and atraumatic. Eyes: Conjunctivae are normal. Normal extraocular movements Cardiovascular: Normal rate, regular rhythm. Normal distal  pulses. Respiratory: Normal respiratory effort. No wheezes/rales/rhonchi. Gastrointestinal: Soft and nontender. No distention. Musculoskeletal: right wrist with subtle swelling on the dorsal side. Patient with decreased flexion range secondary to discomfort. Normal composite fist is noted. Normal hip flexion and extension range on the left. Patient with normal spinal alignment without midline tenderness, spasm, deformity, step-off. He is mildly tender to palpation over the anterior superior iliac spine. Nontender with normal range of motion in all extremities.  Neurologic: cranial nerves II through XII grossly intact.Normal intrinsic and opposition testing. Normal gait without ataxia. Normal speech and language. No gross focal neurologic deficits are appreciated. Skin:  Skin is warm, dry and intact. No rash noted. ____________________________________________   RADIOLOGY  Right Wrist  IMPRESSION: Degenerative changes seen involving the radiocarpal joint. Widening of the scapholunate space is noted is noted suggesting injury of the scapholunate ligament. No fracture or dislocation is noted.  Left Hip/Pelvis  IMPRESSION: No fracture or dislocation of the left hip or pelvis. Mild, early left hip osteoarthrosis.  I, Sequita Wise, Charlesetta Ivory, personally viewed and evaluated these images (plain radiographs) as part of my medical decision making, as well as reviewing the written report by the radiologist. ____________________________________________  PROCEDURES  Wrist cock-up splint Naproxen 500 mg PO ____________________________________________  INITIAL IMPRESSION / ASSESSMENT AND PLAN / ED COURSE  Patient with ED evaluation of injury sustained following a mechanical fall at work today. He sustained a wrist sprain and a left hip and pelvic contusion. No radiologic evidence of fracture to the hip or the wrist. He does have some widening of the scapholunate junction to suggest a scapholunate  ligament injury. He is placed in a wrist cock-up splint for support, and will follow-up with orthopedics management. A prescription for naproxen is provided for symptom relief. At work he was also provided for tonight as requested. ____________________________________________  FINAL CLINICAL IMPRESSION(S) / ED DIAGNOSES  Final diagnoses:  Fall, initial encounter  Sprain of right wrist, initial encounter  Contusion of left hip, initial encounter      Lissa Hoard, PA-C 02/07/17 1632    Dionne Bucy, MD 02/07/17 [phone]

## 2017-02-07 NOTE — ED Triage Notes (Signed)
Pt slipped in freezer at work and now c/o right wrist pain and left hip pain. Ambulatory in triage. VS stable.

## 2017-02-07 NOTE — Discharge Instructions (Signed)
Your exam and x-rays are essentially normal following your fall. You don't have a fracture to the hip or wrist, but you do have a sprain to the wrist. Wear the splint as needed directed for support. Follow-up with Dr. Joice Lofts for further evaluation. Take the Naproxen as directed for pain and inflammation. Apply ice to reduce pain and swelling.

## 2017-03-26 ENCOUNTER — Encounter: Payer: Self-pay | Admitting: Cardiovascular Disease

## 2017-03-26 ENCOUNTER — Ambulatory Visit (INDEPENDENT_AMBULATORY_CARE_PROVIDER_SITE_OTHER): Payer: Self-pay | Admitting: Cardiovascular Disease

## 2017-03-26 VITALS — BP 90/70 | HR 61 | Ht 67.0 in | Wt 185.2 lb

## 2017-03-26 DIAGNOSIS — I10 Essential (primary) hypertension: Secondary | ICD-10-CM

## 2017-03-26 DIAGNOSIS — Z720 Tobacco use: Secondary | ICD-10-CM

## 2017-03-26 DIAGNOSIS — E785 Hyperlipidemia, unspecified: Secondary | ICD-10-CM

## 2017-03-26 NOTE — Patient Instructions (Signed)
Medication Instructions:  Your physician has recommended you make the following change in your medication:  STOP taking amlodipine   Labwork: none  Testing/Procedures: none  Follow-Up: Your physician recommends that you schedule a follow-up appointment as needed.    Any Other Special Instructions Will Be Listed Below (If Applicable).     If you need a refill on your cardiac medications before your next appointment, please call your pharmacy.

## 2017-03-26 NOTE — Progress Notes (Signed)
Cardiology Office Note   Date:  03/26/2017   ID:  Kyle Olsen Stretch, DOB 09/03/1960, MRN [id number]  PCP:  Lavinia SharpsPlacey, Mary Ann, NP  Cardiologist:   Lorine BearsMuhammad Karalyn Kadel, MD   Chief Complaint  Patient presents with  . other    3 month f/u no complaints today. Meds reviewed verbally with pt.      History of Present Illness: Kyle Olsen Mervine is a 56 y.o. male who presents for a follow-up visit regarding hypertension and possible renal artery stenosis. He was hospitalized in July for chest pain and shortness of breath in the setting of uncontrolled hypertension. He had an echocardiogram done which showed normal LV systolic function with grade 1 diastolic dysfunction. Nuclear stress test showed no evidence of ischemia with normal ejection fraction. Renal artery duplex was suggestive of significant right renal artery stenosis. Peak velocity was 242 with renal to aortic ratio of 4.2. There was questionable finding of a solid mass in the interpolar right kidney with recommendations for further imaging with CT or MRI. I proceeded with CTA of the renal arteries which showed no significant left renal artery stenosis.  On the right side, there were 2 renal arteries with significant ostial disease in the accessory inferior right renal artery which was overall small.  The other superior renal artery was large and normal.  The patient has been doing well overall with no chest pain or worsening dyspnea.  He did have a follow-up fracture of his right forearm.  Blood pressure has been controlled and if anything has been somewhat on the low side.  Continues to smoke.  Past Medical History:  Diagnosis Date  . Diverticulitis   . GI bleed   . HLD (hyperlipidemia)   . Hypertension     Past Surgical History:  Procedure Laterality Date  . APPENDECTOMY    . TONSILLECTOMY       Current Outpatient Medications  Medication Sig Dispense Refill  . amLODipine (NORVASC) 5 MG tablet Take 1 tablet (5 mg total) by mouth  daily. 30 tablet 2  . aspirin 81 MG chewable tablet Chew 1 tablet (81 mg total) by mouth daily. 30 tablet 2  . atorvastatin (LIPITOR) 40 MG tablet Take 1 tablet (40 mg total) by mouth daily at 6 PM. 30 tablet 2  . carvedilol (COREG) 12.5 MG tablet Take 1 tablet (12.5 mg total) by mouth 2 (two) times daily with a meal. 60 tablet 2  . cyclobenzaprine (FLEXERIL) 10 MG tablet Take 1 tablet (10 mg total) by mouth 3 (three) times daily as needed for muscle spasms. 20 tablet 0  . esomeprazole (NEXIUM) 40 MG capsule Take 40 mg 2 (two) times daily before a meal by mouth.    . gabapentin (NEURONTIN) 300 MG capsule Take 300 mg by mouth 2 (two) times daily.     Marland Kitchen. lisinopril (PRINIVIL,ZESTRIL) 20 MG tablet Take 1 tablet (20 mg total) by mouth daily. 30 tablet 2  . omeprazole (PRILOSEC) 20 MG capsule Take 20 mg by mouth daily.    . traMADol (ULTRAM) 50 MG tablet Take every 6 (six) hours as needed by mouth.     No current facility-administered medications for this visit.     Allergies:   Patient has no known allergies.    Social History:  The patient  reports that he has been smoking cigarettes.  He has a 40.00 pack-year smoking history. he has never used smokeless tobacco. He reports that he does not drink alcohol or use drugs.  Family History:  The patient's family history includes Heart disease in his sister.    ROS:  Please see the history of present illness.   Otherwise, review of systems are positive for none.   All other systems are reviewed and negative.    PHYSICAL EXAM: VS:  BP 90/70 (BP Location: Left Arm, Patient Position: Sitting, Cuff Size: Normal)   Pulse 61   Ht 5\' 7"  (1.702 m)   Wt 185 lb 4 oz (84 kg)   BMI 29.01 kg/m  , BMI Body mass index is 29.01 kg/m. GEN: Well nourished, well developed, in no acute distress  HEENT: normal  Neck: no JVD, carotid bruits, or masses Cardiac: RRR; no murmurs, rubs, or gallops,no edema  Respiratory:  clear to auscultation bilaterally, normal  work of breathing GI: soft, nontender, nondistended, + BS MS: no deformity or atrophy  Skin: warm and dry, no rash Neuro:  Strength and sensation are intact Psych: euthymic mood, full affect   EKG:  EKG is  ordered today. EKG showed normal sinus rhythm with no significant ST or T wave changes.  Recent Labs: 11/25/2016: ALT 25; B Natriuretic Peptide 19.0 11/26/2016: BUN 14; Creatinine, Ser 1.04; Hemoglobin 17.5; Platelets 234; Potassium 4.2; Sodium 139; TSH 3.334    Lipid Panel    Component Value Date/Time   CHOL 232 (H) 11/25/2016 2234   TRIG 179 (H) 11/25/2016 2234   HDL 34 (L) 11/25/2016 2234   CHOLHDL 6.8 11/25/2016 2234   VLDL 36 11/25/2016 2234   LDLCALC 162 (H) 11/25/2016 2234      Wt Readings from Last 3 Encounters:  03/26/17 185 lb 4 oz (84 kg)  02/07/17 175 lb (79.4 kg)  12/29/16 177 lb (80.3 kg)       No flowsheet data found.    ASSESSMENT AND PLAN:  1.  Essential hypertension: He only has stenosis in the small right inferior renal artery.  No indication for revascularization.  Blood pressure is well controlled on current medications and if anything his blood pressure has been actually running low.  I elected to discontinue amlodipine. Continue treatment with carvedilol and lisinopril.  3. Hyperlipidemia: LDL was 162. He was started on atorvastatin.  Continue to follow-up with primary care physician.  4. Tobacco use: I discussed with him the importance of smoking cessation.    Disposition:   FU with me as needed.  Signed,  Lorine BearsMuhammad Paschal Blanton, MD  03/26/2017 11:05 AM    [facility]

## 2017-03-31 NOTE — Addendum Note (Signed)
Addended by: Kendrick FriesLOPEZ, Dametri Ozburn C on: 03/31/2017 08:40 AM   Modules accepted: Orders

## 2017-06-28 DIAGNOSIS — M19039 Primary osteoarthritis, unspecified wrist: Secondary | ICD-10-CM | POA: Diagnosis not present

## 2017-06-28 DIAGNOSIS — T1490XA Injury, unspecified, initial encounter: Secondary | ICD-10-CM | POA: Diagnosis not present

## 2017-06-28 DIAGNOSIS — G90511 Complex regional pain syndrome I of right upper limb: Secondary | ICD-10-CM | POA: Diagnosis not present

## 2017-07-20 ENCOUNTER — Telehealth: Payer: Self-pay | Admitting: Cardiovascular Disease

## 2017-07-20 NOTE — Telephone Encounter (Signed)
[facility]

## 2017-07-21 ENCOUNTER — Emergency Department
Admission: EM | Admit: 2017-07-21 | Discharge: 2017-07-21 | Disposition: A | Discharge status: Home / Self Care | Payer: Self-pay | Attending: Student in an Organized Health Care Education/Training Program | Admitting: Student in an Organized Health Care Education/Training Program

## 2017-07-21 ENCOUNTER — Emergency Department: Payer: Self-pay

## 2017-07-21 ENCOUNTER — Encounter: Payer: Self-pay | Admitting: Emergency Medicine

## 2017-07-21 ENCOUNTER — Other Ambulatory Visit: Payer: Self-pay

## 2017-07-21 DIAGNOSIS — Z7982 Long term (current) use of aspirin: Secondary | ICD-10-CM | POA: Insufficient documentation

## 2017-07-21 DIAGNOSIS — H5712 Ocular pain, left eye: Secondary | ICD-10-CM | POA: Insufficient documentation

## 2017-07-21 DIAGNOSIS — I10 Essential (primary) hypertension: Secondary | ICD-10-CM | POA: Insufficient documentation

## 2017-07-21 DIAGNOSIS — R519 Headache, unspecified: Secondary | ICD-10-CM

## 2017-07-21 DIAGNOSIS — F17210 Nicotine dependence, cigarettes, uncomplicated: Secondary | ICD-10-CM | POA: Insufficient documentation

## 2017-07-21 DIAGNOSIS — Z79899 Other long term (current) drug therapy: Secondary | ICD-10-CM | POA: Insufficient documentation

## 2017-07-21 DIAGNOSIS — R51 Headache: Secondary | ICD-10-CM | POA: Insufficient documentation

## 2017-07-21 LAB — BASIC METABOLIC PANEL
Anion gap: 9 (ref 5–15)
BUN: 16 mg/dL (ref 6–20)
CALCIUM: 9.1 mg/dL (ref 8.9–10.3)
CO2: 25 mmol/L (ref 22–32)
CREATININE: 1.04 mg/dL (ref 0.61–1.24)
Chloride: 105 mmol/L (ref 101–111)
GFR calc Af Amer: >60 mL/min (ref >60)
GLUCOSE: 119 mg/dL — AB (ref 65–99)
Potassium: 4.1 mmol/L (ref 3.5–5.1)
Sodium: 139 mmol/L (ref 135–145)

## 2017-07-21 LAB — CBC WITH DIFFERENTIAL/PLATELET
BASOS PCT: 0 %
Basophils Absolute: 0 10*3/uL (ref 0–0.1)
EOS PCT: 4 %
Eosinophils Absolute: 0.3 10*3/uL (ref 0–0.7)
HCT: 48.9 % (ref 40.0–52.0)
Hemoglobin: 16.3 g/dL (ref 13.0–18.0)
LYMPHS PCT: 27 %
Lymphs Abs: 2.4 10*3/uL (ref 1.0–3.6)
MCH: 28.9 pg (ref 26.0–34.0)
MCHC: 33.4 g/dL (ref 32.0–36.0)
MCV: 86.5 fL (ref 80.0–100.0)
MONO ABS: 0.6 10*3/uL (ref 0.2–1.0)
Monocytes Relative: 7 %
Neutro Abs: 5.4 10*3/uL (ref 1.4–6.5)
Neutrophils Relative %: 62 %
PLATELETS: 233 10*3/uL (ref 150–440)
RBC: 5.65 MIL/uL (ref 4.40–5.90)
RDW: 14.5 % (ref 11.5–14.5)
WBC: 8.9 10*3/uL (ref 3.8–10.6)

## 2017-07-21 MED ORDER — ACETAMINOPHEN 500 MG PO TABS
1000.0000 mg | ORAL_TABLET | Freq: Once | ORAL | Status: AC
  Administered 2017-07-21: 1000 mg via ORAL
  Filled 2017-07-21: qty 2

## 2017-07-21 MED ORDER — DIPHENHYDRAMINE HCL 50 MG/ML IJ SOLN
12.5000 mg | Freq: Once | INTRAMUSCULAR | Status: AC
  Administered 2017-07-21: 12.5 mg via INTRAVENOUS
  Filled 2017-07-21: qty 1

## 2017-07-21 MED ORDER — FLUORESCEIN SODIUM 1 MG OP STRP
ORAL_STRIP | OPHTHALMIC | Status: AC
Start: 2017-07-21 — End: 2017-07-21
  Administered 2017-07-21: 1 via OPHTHALMIC
  Filled 2017-07-21: qty 1

## 2017-07-21 MED ORDER — PROCHLORPERAZINE EDISYLATE 5 MG/ML IJ SOLN
10.0000 mg | Freq: Once | INTRAMUSCULAR | Status: AC
  Administered 2017-07-21: 10 mg via INTRAVENOUS
  Filled 2017-07-21: qty 2

## 2017-07-21 MED ORDER — TETRACAINE HCL 0.5 % OP SOLN
OPHTHALMIC | Status: AC
Start: 2017-07-21 — End: 2017-07-21
  Administered 2017-07-21: 1 [drp] via OPHTHALMIC
  Filled 2017-07-21: qty 4

## 2017-07-21 MED ORDER — FLUORESCEIN SODIUM 1 MG OP STRP
1.0000 | ORAL_STRIP | Freq: Once | OPHTHALMIC | Status: AC
  Administered 2017-07-21: 1 via OPHTHALMIC

## 2017-07-21 MED ORDER — TETRACAINE HCL 0.5 % OP SOLN
1.0000 [drp] | Freq: Once | OPHTHALMIC | Status: AC
  Administered 2017-07-21: 1 [drp] via OPHTHALMIC

## 2017-07-21 NOTE — ED Notes (Addendum)
Normal vision in left eye 20/70 per pt. 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

## 2017-07-21 NOTE — Discharge Instructions (Signed)

## 2017-07-21 NOTE — ED Provider Notes (Addendum)
[facility] Emergency Department Provider Note    None    (approximate)  I have reviewed the triage vital signs and the nursing notes.   HISTORY  Chief Complaint Eye Pain    HPI Rudi RummageBilly Tiu is a 57 y.o. male presents with chief complaint of left eye pain that started yesterday afternoon.  Does feel the sudden in onset associated with blurry vision.  Denies any trauma.  Feels it is a pressure behind his eye.  Is never had a headache like this before.  No neck stiffness.  No fevers.  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  Scribes the pain is aching and pressure-like that is mild to moderate.  No numbness or tingling.  Past Medical History:  Diagnosis Date  . Diverticulitis   . GI bleed   . HLD (hyperlipidemia)   . Hypertension    Family History  Problem Relation Age of Onset  . Heart disease Sister    Past Surgical History:  Procedure Laterality Date  . APPENDECTOMY    . TONSILLECTOMY     Patient Active Problem List   Diagnosis Date Noted  . Chest pain with moderate risk for cardiac etiology 11/26/2016  . HLD (hyperlipidemia) 11/26/2016  . Hypertensive emergency 11/25/2016  . Aortic atherosclerosis (HCC) 11/25/2016  . Tobacco abuse 11/25/2016      Prior to Admission medications   Medication Sig Start Date End Date Taking? Authorizing Provider  aspirin 81 MG chewable tablet Chew 1 tablet (81 mg total) by mouth daily. 11/26/16   Enid BaasKalisetti, Radhika, MD  atorvastatin (LIPITOR) 40 MG tablet Take 1 tablet (40 mg total) by mouth daily at 6 PM. 11/26/16   Enid BaasKalisetti, Radhika, MD  carvedilol (COREG) 12.5 MG tablet Take 1 tablet (12.5 mg total) by mouth 2 (two) times daily with a meal. 11/26/16   Enid BaasKalisetti, Radhika, MD  cyclobenzaprine (FLEXERIL) 10 MG tablet Take 1 tablet (10 mg total) by mouth 3 (three) times daily as needed for muscle spasms. 12/29/16   Little, Traci M, PA-C  esomeprazole (NEXIUM)  40 MG capsule Take 40 mg 2 (two) times daily before a meal by mouth.    [provider]  gabapentin (NEURONTIN) 300 MG capsule Take 300 mg by mouth 2 (two) times daily.     [provider]  lisinopril (PRINIVIL,ZESTRIL) 20 MG tablet Take 1 tablet (20 mg total) by mouth daily. 11/26/16   Enid BaasKalisetti, Radhika, MD  omeprazole (PRILOSEC) 20 MG capsule Take 20 mg by mouth daily.    [provider]  traMADol (ULTRAM) 50 MG tablet Take every 6 (six) hours as needed by mouth.    [provider]    Allergies Patient has no known allergies.    Social History Social History   Tobacco Use  . Smoking status: Current Every Day Smoker    Packs/day: 1.00    Years: 40.00    Pack years: 40.00    Types: Cigarettes  . Smokeless tobacco: Never Used  Substance Use Topics  . Alcohol use: No    Comment: occasionally (once per month)  . Drug use: No    Review of Systems Patient denies headaches, rhinorrhea, blurry vision, numbness, shortness of breath, chest pain, edema, cough, abdominal pain, nausea, vomiting, diarrhea, dysuria, fevers, rashes or hallucinations unless otherwise stated above in HPI. ____________________________________________   PHYSICAL EXAM:  VITAL SIGNS: Vitals:   07/21/17 1252  BP: 126/72  Pulse: 80  Resp: 18  Temp: (!) 97.5 F (36.4 C)  SpO2: 95%    Constitutional: Alert and oriented. Well appearing and in no acute distress. Eyes: Conjunctivae are normal.  Does have right exotropia right eye.  Left eye pressure of 13.  No hyphema or hypopyon.  Posterior chamber shows no evidence of hemorrhage.  Do not observe any detachment. Head: Atraumatic. Nose: No congestion/rhinnorhea. Mouth/Throat: Mucous membranes are moist.   Neck: No stridor. Painless ROM.  Cardiovascular: Normal rate, regular rhythm. Grossly normal heart sounds.  Good peripheral circulation. Respiratory: Normal respiratory effort.  No retractions. Lungs  CTAB. Gastrointestinal: Soft and nontender. No distention. No abdominal bruits. No CVA tenderness. Genitourinary:  Musculoskeletal: No lower extremity tenderness nor edema.  No joint effusions. Neurologic:  Normal speech and language. No gross focal neurologic deficits are appreciated. No facial droop Skin:  Skin is warm, dry and intact. No rash noted. Psychiatric: Mood and affect are normal. Speech and behavior are normal.  ____________________________________________   LABS (all labs ordered are listed, but only abnormal results are displayed)  Results for orders placed or performed during the hospital encounter of 07/21/17 (from the past 24 hour(s))  CBC with Differential     Status: None   Collection Time: 07/21/17  1:01 PM  Result Value Ref Range   WBC 8.9 3.8 - 10.6 K/uL   RBC 5.65 4.40 - 5.90 MIL/uL   Hemoglobin 16.3 13.0 - 18.0 g/dL   HCT 16.1 09.6 - 04.5 %   MCV 86.5 80.0 - 100.0 fL   MCH 28.9 26.0 - 34.0 pg   MCHC 33.4 32.0 - 36.0 g/dL   RDW 40.9 81.1 - 91.4 %   Platelets 233 150 - 440 K/uL   Neutrophils Relative % 62 %   Neutro Abs 5.4 1.4 - 6.5 K/uL   Lymphocytes Relative 27 %   Lymphs Abs 2.4 1.0 - 3.6 K/uL   Monocytes Relative 7 %   Monocytes Absolute 0.6 0.2 - 1.0 K/uL   Eosinophils Relative 4 %   Eosinophils Absolute 0.3 0 - 0.7 K/uL   Basophils Relative 0 %   Basophils Absolute 0.0 0 - 0.1 K/uL  Basic metabolic panel     Status: Abnormal   Collection Time: 07/21/17  1:01 PM  Result Value Ref Range   Sodium 139 135 - 145 mmol/L   Potassium 4.1 3.5 - 5.1 mmol/L   Chloride 105 101 - 111 mmol/L   CO2 25 22 - 32 mmol/L   Glucose, Bld 119 (H) 65 - 99 mg/dL   BUN 16 6 - 20 mg/dL   Creatinine, Ser 7.82 0.61 - 1.24 mg/dL   Calcium 9.1 8.9 - 95.6 mg/dL   GFR calc non Af Amer >60 >60 mL/min   GFR calc Af Amer >60 >60 mL/min   Anion gap 9 5 - 15   ____________________________________________ ____________________________________________  RADIOLOGY  I  personally reviewed all radiographic images ordered to evaluate for the above acute complaints and reviewed radiology reports and findings.  These findings were personally discussed with the patient.  Please see medical record for radiology report.  ____________________________________________   PROCEDURES  Procedure(s) performed:  Procedures    Critical Care performed: no ____________________________________________   INITIAL IMPRESSION / ASSESSMENT AND PLAN / ED COURSE  Pertinent labs & imaging results that were available during my care of the patient were reviewed by me and considered in my medical decision making (see chart for details).  DDX: Cyndie Chime, cluster, migraine, hematoma, orbital cellulitis,  iritis, glaucoma  Asia Fullington is a 57 y.o. who presents to the ED with p/w HA anf left eye pain since yesterday. Not worst HA ever.  Stresses pressure behind left eye. HA similar to previous episodes.  Does not endorse blurry vision.  Visual acuity in left eye is 20/100 and patient is legally blind in the right.  No clinical signs of proptosis.  Intraocular pressure in the left is 13.  No evidence of corneal abrasion.  CT imaging ordered for the above differential shows no evidence of mass or retrobulbar hematoma.  afebrile in ED. VSS. Exam as above. No meningeal signs. No CN, motor, sensory or cerebellar deficits. Temporal arteries palpable and non-tender. Appears well and non-toxic.  Will provide IV fluids for hydration and IV medications for symptom control.  Likely tension, non-specific or possible migraine HA. Clinical picture is not consistent with ICH, SAH, SDH, EDH, TIA, or CVA.  Low suspicion for MS given lack of other neuro findings no concern for meningitis or encephalitis. No concern for GCA/Temporal arteritis.   ----------------------------------------- 4:17 PM on 07/21/2017 -----------------------------------------   Pain improved.  Spoke with Dr. Druscilla Brownie for  ophthalmology who kindly agrees to see patient first thing in the morning.  Repeat neuro exam is again without focal deficit, nuchal rigidity or evidence of meningeal irritation.  Stable to D/C home, follow up with PCP or Neurology if persistent recurrent Has.  Have discussed with the patient and available family all diagnostics and treatments performed thus far and all questions were answered to the best of my ability. The patient demonstrates understanding and agreement with plan.       ____________________________________________   FINAL CLINICAL IMPRESSION(S) / ED DIAGNOSES  Final diagnoses:  Bad headache  Pain of left eye      NEW MEDICATIONS STARTED DURING THIS VISIT:  New Prescriptions   No medications on file     Note:  This document was prepared using Dragon voice recognition software and may include unintentional dictation errors.    Willy Eddy, MD 07/21/17 1617    Willy Eddy, MD 07/21/17 [phone]

## 2017-07-21 NOTE — ED Triage Notes (Signed)
Pt c/o severe left eye pain with blurry vision to this eye.  Denies loss of visual field. Denies headache.  No spots or floaters.  Started at noon yesterday.

## 2017-07-21 NOTE — ED Triage Notes (Signed)
Discussed pt with dr veronese 

## 2017-11-12 DIAGNOSIS — Z981 Arthrodesis status: Secondary | ICD-10-CM | POA: Diagnosis not present

## 2017-11-12 DIAGNOSIS — M19039 Primary osteoarthritis, unspecified wrist: Secondary | ICD-10-CM | POA: Diagnosis not present

## 2017-11-12 DIAGNOSIS — M19031 Primary osteoarthritis, right wrist: Secondary | ICD-10-CM | POA: Diagnosis not present

## 2017-12-17 DIAGNOSIS — M19039 Primary osteoarthritis, unspecified wrist: Secondary | ICD-10-CM | POA: Diagnosis not present

## 2017-12-17 DIAGNOSIS — Z981 Arthrodesis status: Secondary | ICD-10-CM | POA: Diagnosis not present

## 2017-12-18 ENCOUNTER — Other Ambulatory Visit: Payer: Self-pay

## 2017-12-18 ENCOUNTER — Emergency Department
Admission: EM | Admit: 2017-12-18 | Discharge: 2017-12-18 | Disposition: A | Discharge status: Home / Self Care | Payer: Medicaid Other | Attending: Emergency Medicine | Admitting: Emergency Medicine

## 2017-12-18 ENCOUNTER — Emergency Department: Payer: Medicaid Other

## 2017-12-18 ENCOUNTER — Encounter: Payer: Self-pay | Admitting: Emergency Medicine

## 2017-12-18 DIAGNOSIS — Z79899 Other long term (current) drug therapy: Secondary | ICD-10-CM | POA: Diagnosis not present

## 2017-12-18 DIAGNOSIS — K5732 Diverticulitis of large intestine without perforation or abscess without bleeding: Secondary | ICD-10-CM | POA: Insufficient documentation

## 2017-12-18 DIAGNOSIS — F17210 Nicotine dependence, cigarettes, uncomplicated: Secondary | ICD-10-CM | POA: Insufficient documentation

## 2017-12-18 DIAGNOSIS — Z7982 Long term (current) use of aspirin: Secondary | ICD-10-CM | POA: Diagnosis not present

## 2017-12-18 DIAGNOSIS — R197 Diarrhea, unspecified: Secondary | ICD-10-CM | POA: Diagnosis not present

## 2017-12-18 DIAGNOSIS — R1032 Left lower quadrant pain: Secondary | ICD-10-CM

## 2017-12-18 DIAGNOSIS — I10 Essential (primary) hypertension: Secondary | ICD-10-CM | POA: Insufficient documentation

## 2017-12-18 LAB — URINALYSIS, COMPLETE (UACMP) WITH MICROSCOPIC
BILIRUBIN URINE: NEGATIVE
Bacteria, UA: NONE SEEN
Glucose, UA: NEGATIVE mg/dL
Hgb urine dipstick: NEGATIVE
Ketones, ur: NEGATIVE mg/dL
Leukocytes, UA: NEGATIVE
Nitrite: NEGATIVE
PROTEIN: NEGATIVE mg/dL
Specific Gravity, Urine: 1.016 (ref 1.005–1.030)
pH: 5 (ref 5.0–8.0)

## 2017-12-18 LAB — CBC
HCT: 47.3 % (ref 40.0–52.0)
HEMOGLOBIN: 16.4 g/dL (ref 13.0–18.0)
MCH: 29.9 pg (ref 26.0–34.0)
MCHC: 34.6 g/dL (ref 32.0–36.0)
MCV: 86.6 fL (ref 80.0–100.0)
PLATELETS: 224 10*3/uL (ref 150–440)
RBC: 5.47 MIL/uL (ref 4.40–5.90)
RDW: 14.2 % (ref 11.5–14.5)
WBC: 13.3 10*3/uL — ABNORMAL HIGH (ref 3.8–10.6)

## 2017-12-18 LAB — BASIC METABOLIC PANEL
Anion gap: 8 (ref 5–15)
BUN: 14 mg/dL (ref 6–20)
CHLORIDE: 105 mmol/L (ref 98–111)
CO2: 28 mmol/L (ref 22–32)
CREATININE: 0.98 mg/dL (ref 0.61–1.24)
Calcium: 8.9 mg/dL (ref 8.9–10.3)
Glucose, Bld: 116 mg/dL — ABNORMAL HIGH (ref 70–99)
POTASSIUM: 3.6 mmol/L (ref 3.5–5.1)
SODIUM: 141 mmol/L (ref 135–145)

## 2017-12-18 MED ORDER — METRONIDAZOLE 500 MG PO TABS: 500.0000 mg | ORAL_TABLET | Freq: Three times a day (TID) | ORAL | 0 refills | Status: AC

## 2017-12-18 MED ORDER — SODIUM CHLORIDE 0.9 % IV BOLUS
1000.0000 mL | Freq: Once | INTRAVENOUS | Status: AC
  Administered 2017-12-18: 1000 mL via INTRAVENOUS

## 2017-12-18 MED ORDER — CIPROFLOXACIN HCL 500 MG PO TABS
500.0000 mg | ORAL_TABLET | Freq: Once | ORAL | Status: AC
  Administered 2017-12-18: 500 mg via ORAL
  Filled 2017-12-18: qty 1

## 2017-12-18 MED ORDER — IOHEXOL 300 MG/ML  SOLN
100.0000 mL | Freq: Once | INTRAMUSCULAR | Status: AC | PRN
  Administered 2017-12-18: 100 mL via INTRAVENOUS

## 2017-12-18 MED ORDER — CIPROFLOXACIN HCL 500 MG PO TABS: 500.0000 mg | ORAL_TABLET | Freq: Two times a day (BID) | ORAL | 0 refills | Status: AC

## 2017-12-18 MED ORDER — NAPROXEN 500 MG PO TABS: 500.0000 mg | ORAL_TABLET | Freq: Two times a day (BID) | ORAL | 0 refills | Status: AC

## 2017-12-18 MED ORDER — KETOROLAC TROMETHAMINE 30 MG/ML IJ SOLN
15.0000 mg | INTRAMUSCULAR | Status: AC
  Administered 2017-12-18: 15 mg via INTRAVENOUS
  Filled 2017-12-18: qty 1

## 2017-12-18 MED ORDER — METRONIDAZOLE 500 MG PO TABS
500.0000 mg | ORAL_TABLET | Freq: Once | ORAL | Status: AC
  Administered 2017-12-18: 500 mg via ORAL
  Filled 2017-12-18: qty 1

## 2017-12-18 MED ORDER — ONDANSETRON 4 MG PO TBDP: 4.0000 mg | ORAL_TABLET | Freq: Three times a day (TID) | ORAL | 0 refills | Status: AC | PRN

## 2017-12-18 NOTE — Discharge Instructions (Addendum)
Your CT scan today shows diverticulitis, an inflammation of your large intestine.  Take antibiotics as prescribed as well as naproxen and Zofran for symptom relief.

## 2017-12-18 NOTE — ED Triage Notes (Signed)
Pt arrived via POV with reports of left flank pain x 1 week, states the pain is worse since last night. Hx of kidney stones in the past.  C/o dysuria.

## 2017-12-18 NOTE — ED Notes (Signed)
Pt passed PO challenge. Denies N/V. Pt ready for dc

## 2017-12-18 NOTE — ED Provider Notes (Signed)
[facility] Emergency Department Provider Note  ____________________________________________  Time seen: Approximately 3:58 PM  I have reviewed the triage vital signs and the nursing notes.   HISTORY  Chief Complaint Flank Pain    HPI Kyle Olsen is a 57 y.o. male with a history of diverticulitis hyperlipidemia hypertension and kidney stones who complains of left flank pain rating the left lower quadrant  for the past week.  Gradual onset, constant, worse with movement, no alleviating factors.  Waxing and waning.  Moderate intensity and sharp.  Denies dysuria frequency hematuria.  No fevers chills or sweats.     Past Medical History:  Diagnosis Date  . Diverticulitis   . GI bleed   . HLD (hyperlipidemia)   . Hypertension      Patient Active Problem List   Diagnosis Date Noted  . Chest pain with moderate risk for cardiac etiology 11/26/2016  . HLD (hyperlipidemia) 11/26/2016  . Hypertensive emergency 11/25/2016  . Aortic atherosclerosis (HCC) 11/25/2016  . Tobacco abuse 11/25/2016     Past Surgical History:  Procedure Laterality Date  . APPENDECTOMY    . TONSILLECTOMY       Prior to Admission medications   Medication Sig Start Date End Date Taking? Authorizing Provider  amLODipine (NORVASC) 5 MG tablet Take 5 mg by mouth daily.   Yes [provider]  aspirin 81 MG chewable tablet Chew 1 tablet (81 mg total) by mouth daily. 11/26/16  Yes Enid Baas, MD  atorvastatin (LIPITOR) 40 MG tablet Take 1 tablet (40 mg total) by mouth daily at 6 PM. 11/26/16  Yes Enid Baas, MD  carvedilol (COREG) 12.5 MG tablet Take 1 tablet (12.5 mg total) by mouth 2 (two) times daily with a meal. 11/26/16  Yes Enid Baas, MD  esomeprazole (NEXIUM) 40 MG capsule Take 40 mg 2 (two) times daily before a meal by mouth.   Yes [provider]  gabapentin (NEURONTIN) 300 MG capsule Take 900 mg by mouth at bedtime.    Yes [provider]  lisinopril (PRINIVIL,ZESTRIL) 20 MG tablet Take 1 tablet (20 mg total) by mouth daily. 11/26/16  Yes Enid Baas, MD  omeprazole (PRILOSEC) 20 MG capsule Take 20 mg by mouth daily.   Yes [provider]  ciprofloxacin (CIPRO) 500 MG tablet Take 1 tablet (500 mg total) by mouth 2 (two) times daily. 12/18/17   Sharman Cheek, MD  cyclobenzaprine (FLEXERIL) 10 MG tablet Take 1 tablet (10 mg total) by mouth 3 (three) times daily as needed for muscle spasms. Patient not taking: Reported on 12/18/2017 12/29/16   Little, Traci M, PA-C  metroNIDAZOLE (FLAGYL) 500 MG tablet Take 1 tablet (500 mg total) by mouth 3 (three) times daily. 12/18/17   Sharman Cheek, MD  naproxen (NAPROSYN) 500 MG tablet Take 1 tablet (500 mg total) by mouth 2 (two) times daily with a meal. 12/18/17   Sharman Cheek, MD  ondansetron (ZOFRAN ODT) 4 MG disintegrating tablet Take 1 tablet (4 mg total) by mouth every 8 (eight) hours as needed for nausea or vomiting. 12/18/17   Sharman Cheek, MD     Allergies Patient has no known allergies.   Family History  Problem Relation Age of Onset  . Heart disease Sister     Social History Social History   Tobacco Use  . Smoking status: Current Every Day Smoker    Packs/day: 1.00    Years: 40.00    Pack years: 40.00    Types: Cigarettes  .  Smokeless tobacco: Never Used  Substance Use Topics  . Alcohol use: No    Comment: occasionally (once per month)  . Drug use: No    Review of Systems  Constitutional:   No fever or chills.  Cardiovascular:   No chest pain or syncope. Respiratory:   No dyspnea or cough. Gastrointestinal: Positive as above abdominal pain without vomiting and diarrhea.  Musculoskeletal:   Negative for focal pain or swelling All other systems reviewed and are negative except as documented above in ROS and HPI.  ____________________________________________   PHYSICAL EXAM:  VITAL SIGNS: ED Triage Vitals  Enc  Vitals Group     BP 12/18/17 1123 (!) 146/86     Pulse Rate 12/18/17 1123 68     Resp 12/18/17 1123 18     Temp 12/18/17 1123 97.6 F (36.4 C)     Temp Source 12/18/17 1123 Oral     SpO2 12/18/17 1123 100 %     Weight 12/18/17 1124 175 lb (79.4 kg)     Height 12/18/17 1124 5\' 7"  (1.702 m)     Head Circumference --      Peak Flow --      Pain Score 12/18/17 1123 8     Pain Loc --      Pain Edu? --      Excl. in GC? --     Vital signs reviewed, nursing assessments reviewed.   Constitutional:   Alert and oriented. Non-toxic appearance. Eyes:   Conjunctivae are normal. EOMI. PERRL. ENT      Head:   Normocephalic and atraumatic.      Nose:   No congestion/rhinnorhea.       Mouth/Throat:   MMM, no pharyngeal erythema. No peritonsillar mass.       Neck:   No meningismus. Full ROM. Hematological/Lymphatic/Immunilogical:   No cervical lymphadenopathy. Cardiovascular:   RRR. Symmetric bilateral radial and DP pulses.  No murmurs. Cap refill less than 2 seconds. Respiratory:   Normal respiratory effort without tachypnea/retractions. Breath sounds are clear and equal bilaterally. No wheezes/rales/rhonchi. Gastrointestinal:   Soft with left lower quadrant tenderness. Non distended. There is no CVA tenderness.  No rebound, rigidity, or guarding. Musculoskeletal:   Normal range of motion in all extremities. No joint effusions.  No lower extremity tenderness.  No edema. Neurologic:   Normal speech and language.  Motor grossly intact. No acute focal neurologic deficits are appreciated.  Skin:    Skin is warm, dry and intact. No rash noted.  No petechiae, purpura, or bullae.  ____________________________________________    LABS (pertinent positives/negatives) (all labs ordered are listed, but only abnormal results are displayed) Labs Reviewed  URINALYSIS, COMPLETE (UACMP) WITH MICROSCOPIC - Abnormal; Notable for the following components:      Result Value   Color, Urine YELLOW (*)     APPearance CLEAR (*)    All other components within normal limits  CBC - Abnormal; Notable for the following components:   WBC 13.3 (*)    All other components within normal limits  BASIC METABOLIC PANEL - Abnormal; Notable for the following components:   Glucose, Bld 116 (*)    All other components within normal limits   ____________________________________________   EKG    ____________________________________________    RADIOLOGY  Ct Abdomen Pelvis W Contrast  Result Date: 12/18/2017 CLINICAL DATA:  Left-sided abdominal and flank pain for 1 week with diarrhea and dysuria. History of diverticulitis and kidney stones. EXAM: CT ABDOMEN AND PELVIS WITH CONTRAST  TECHNIQUE: Multidetector CT imaging of the abdomen and pelvis was performed using the standard protocol following bolus administration of intravenous contrast. CONTRAST:  OMNIPAQUE IOHEXOL 300 MG/ML  SOLN COMPARISON:  12/22/2016 FINDINGS: Lower chest: Stable 6 mm pleural-based nodule, right middle lobe. No acute findings the lung bases. Hepatobiliary: Liver is unremarkable. Gallbladder is mildly distended containing multiple small stones. No wall thickening or inflammation. No bile duct dilation. Pancreas: Unremarkable. No pancreatic ductal dilatation or surrounding inflammatory changes. Spleen: Normal in size without focal abnormality. Adrenals/Urinary Tract: No adrenal masses. Kidneys normal in size, orientation and position. 3 mm stone in the upper pole the right kidney. No other small stone projects in the midpole the right kidney. Tiny stone projects in the upper pole the left kidney. No renal masses. Mild dilation of the right intrarenal collecting system and right ureter, but no ureteral stone is seen. Kidneys demonstrate symmetric enhancement and excretion. Left ureter normal in course and in caliber. Bladder is unremarkable. Stomach/Bowel: Mild inflammation is seen adjacent to the Upper descending colon and proximal sigmoid  colon consistent with mild diverticulitis. There is no extraluminal air. No fluid collection is seen to suggest an abscess. There is extensive colonic diverticula. No other evidence of diverticulitis. Stomach and small bowel are unremarkable. Vascular/Lymphatic: There is diffuse aortic atherosclerosis. Atherosclerotic disease is noted in the aortic branch vessels. No adenopathy. Reproductive: Prostate mildly enlarged measuring 4.5 x 3.7 cm transversely. Other: Small umbilical hernia. Musculoskeletal: No fracture or acute finding. 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 IMPRESSION: 1. Mild uncomplicated diverticulitis involving 2 small segments of the left colon, the proximal descending colon and proximal sigmoid colon. No extraluminal air or abscess. 2. Mild dilation of the right intrarenal collecting system and ureter with no obstructing stone. Given that this patient has pain with left-sided, this is presumed to be physiologic. 3. No other evidence of an acute abnormality. 4. Aortic atherosclerosis. Electronically Signed   By: Amie Portland M.D.   On: 12/18/2017 15:35    ____________________________________________   PROCEDURES Procedures  ____________________________________________  DIFFERENTIAL DIAGNOSIS   Renal colic, pyelonephritis, urinary tract infection, diverticulitis  CLINICAL IMPRESSION / ASSESSMENT AND PLAN / ED COURSE  Pertinent labs & imaging results that were available during my care of the patient were reviewed by me and considered in my medical decision making (see chart for details).    Patient presents with left lower quadrant abdominal pain, gradually worsening over the past week.  Vital signs are normal, labs are normal, urinalysis is negative for infection or microscopic hematuria.  Therefore CT scan is obtained with worry for diverticulitis with possible perforation or abscess formation due to the degree of  tenderness on exam.  CT scan does show mild uncomplicated diverticulitis which can be treated with oral antibiotics and symptom relief and follow-up with primary care.      ____________________________________________   FINAL CLINICAL IMPRESSION(S) / ED DIAGNOSES    Final diagnoses:  Diverticulitis of large intestine without perforation or abscess without bleeding  Abdominal pain, acute, left lower quadrant     ED Discharge Orders         Ordered    ciprofloxacin (CIPRO) 500 MG tablet  2 times daily     12/18/17 1557    metroNIDAZOLE (FLAGYL) 500 MG tablet  3 times daily     12/18/17 1557    naproxen (NAPROSYN) 500 MG tablet  2 times daily with meals     12/18/17  1557    ondansetron (ZOFRAN ODT) 4 MG disintegrating tablet  Every 8 hours PRN     12/18/17 1557          Portions of this note were generated with dragon dictation software. Dictation errors may occur despite best attempts at proofreading.    Sharman Cheek, MD 12/18/17 [phone]

## 2017-12-18 NOTE — ED Notes (Signed)
Pt given crackers, water and apple sauce for PO challenge

## 2017-12-18 NOTE — ED Notes (Signed)
Patient transported to CT 

## 2017-12-18 NOTE — ED Notes (Signed)
Pt ambulated with steady gait into tx room. ABCs intact. NAD.

## 2017-12-18 NOTE — ED Notes (Signed)
MD at bedside with update.

## 2017-12-18 NOTE — ED Notes (Signed)
MD at bedside. 

## 2018-02-11 DIAGNOSIS — M19039 Primary osteoarthritis, unspecified wrist: Secondary | ICD-10-CM | POA: Diagnosis not present

## 2018-02-11 DIAGNOSIS — Z981 Arthrodesis status: Secondary | ICD-10-CM | POA: Diagnosis not present

## 2018-02-11 DIAGNOSIS — M19031 Primary osteoarthritis, right wrist: Secondary | ICD-10-CM | POA: Diagnosis not present

## 2018-05-06 ENCOUNTER — Ambulatory Visit: Payer: Self-pay | Admitting: Family Medicine

## 2021-10-03 ENCOUNTER — Emergency Department (HOSPITAL_COMMUNITY)
Admission: EM | Admit: 2021-10-03 | Discharge: 2021-10-03 | Disposition: A | Discharge status: Home / Self Care | Payer: Medicaid Other | Attending: Emergency Medicine | Admitting: Emergency Medicine

## 2021-10-03 ENCOUNTER — Emergency Department (HOSPITAL_COMMUNITY): Payer: Medicaid Other

## 2021-10-03 ENCOUNTER — Other Ambulatory Visit: Payer: Self-pay

## 2021-10-03 ENCOUNTER — Encounter (HOSPITAL_COMMUNITY): Payer: Self-pay

## 2021-10-03 DIAGNOSIS — Z79899 Other long term (current) drug therapy: Secondary | ICD-10-CM | POA: Diagnosis not present

## 2021-10-03 DIAGNOSIS — Z7982 Long term (current) use of aspirin: Secondary | ICD-10-CM | POA: Insufficient documentation

## 2021-10-03 DIAGNOSIS — R911 Solitary pulmonary nodule: Secondary | ICD-10-CM | POA: Diagnosis not present

## 2021-10-03 DIAGNOSIS — M542 Cervicalgia: Secondary | ICD-10-CM | POA: Diagnosis not present

## 2021-10-03 DIAGNOSIS — J432 Centrilobular emphysema: Secondary | ICD-10-CM | POA: Diagnosis not present

## 2021-10-03 DIAGNOSIS — R200 Anesthesia of skin: Secondary | ICD-10-CM | POA: Diagnosis not present

## 2021-10-03 DIAGNOSIS — S199XXA Unspecified injury of neck, initial encounter: Secondary | ICD-10-CM | POA: Diagnosis not present

## 2021-10-03 LAB — BASIC METABOLIC PANEL
Anion gap: 5 (ref 5–15)
BUN: 13 mg/dL (ref 6–20)
CO2: 27 mmol/L (ref 22–32)
Calcium: 9.4 mg/dL (ref 8.9–10.3)
Chloride: 108 mmol/L (ref 98–111)
Creatinine, Ser: 0.77 mg/dL (ref 0.61–1.24)
GFR, Estimated: >60 mL/min (ref >60)
Glucose, Bld: 105 mg/dL — ABNORMAL HIGH (ref 70–99)
Potassium: 4.3 mmol/L (ref 3.5–5.1)
Sodium: 140 mmol/L (ref 135–145)

## 2021-10-03 LAB — CBC WITH DIFFERENTIAL/PLATELET
Abs Immature Granulocytes: 0.04 10*3/uL (ref 0.00–0.07)
Basophils Absolute: 0.1 10*3/uL (ref 0.0–0.1)
Basophils Relative: 1 %
Eosinophils Absolute: 0.4 10*3/uL (ref 0.0–0.5)
Eosinophils Relative: 5 %
HCT: 45.5 % (ref 39.0–52.0)
Hemoglobin: 14.9 g/dL (ref 13.0–17.0)
Immature Granulocytes: 1 %
Lymphocytes Relative: 29 %
Lymphs Abs: 2.1 10*3/uL (ref 0.7–4.0)
MCH: 28.4 pg (ref 26.0–34.0)
MCHC: 32.7 g/dL (ref 30.0–36.0)
MCV: 86.8 fL (ref 80.0–100.0)
Monocytes Absolute: 0.6 10*3/uL (ref 0.1–1.0)
Monocytes Relative: 9 %
Neutro Abs: 4.1 10*3/uL (ref 1.7–7.7)
Neutrophils Relative %: 55 %
Platelets: 243 10*3/uL (ref 150–400)
RBC: 5.24 MIL/uL (ref 4.22–5.81)
RDW: 14.6 % (ref 11.5–15.5)
WBC: 7.3 10*3/uL (ref 4.0–10.5)
nRBC: 0 % (ref 0.0–0.2)

## 2021-10-03 MED ORDER — METHOCARBAMOL 500 MG PO TABS: 500.0000 mg | ORAL_TABLET | Freq: Once | ORAL | Status: DC

## 2021-10-03 MED ORDER — KETOROLAC TROMETHAMINE 30 MG/ML IJ SOLN: 30.0000 mg | Freq: Once | INTRAMUSCULAR | Status: DC

## 2021-10-03 MED ORDER — METHOCARBAMOL 500 MG PO TABS
500.0000 mg | ORAL_TABLET | Freq: Once | ORAL | Status: AC
  Administered 2021-10-03: 500 mg via ORAL
  Filled 2021-10-03: qty 1

## 2021-10-03 MED ORDER — IOHEXOL 350 MG/ML SOLN
100.0000 mL | Freq: Once | INTRAVENOUS | Status: AC | PRN
  Administered 2021-10-03: 100 mL via INTRAVENOUS

## 2021-10-03 MED ORDER — MORPHINE SULFATE (PF) 4 MG/ML IV SOLN
4.0000 mg | Freq: Once | INTRAVENOUS | Status: AC
  Administered 2021-10-03: 4 mg via INTRAVENOUS
  Filled 2021-10-03: qty 1

## 2021-10-03 NOTE — ED Provider Notes (Signed)
[facility] DEPT Provider Note   CSN: [id number] Arrival date & time: 10/03/21  1108     History PMH: HTN, HLD, tobacco use, compression fractures in cervical spine Chief Complaint  Patient presents with   Neck Pain    Kyle Olsen is a 61 y.o. male. Presents the ED with chief complaint of neck pain.  He states 2 days ago he was going on reverse with his forklift and hit something.  He hyperextended his neck during this time and had acute onset of pain.  He says it is to the point where he is barely able to move his neck due to the pain.  Rates it 7/10. He has been using antiinflammatories at home without relief. He does state that he has had intermittent numbness of his left arm extending from all 5 fingers into his left elbow.  He states that this feels like it is falling asleep as well as with associated tingling sensations.  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   Neck Pain Associated symptoms: numbness       Home Medications Prior to Admission medications   Medication Sig Start Date End Date Taking? Authorizing Provider  amLODipine (NORVASC) 5 MG tablet Take 5 mg by mouth daily.    [provider]  aspirin 81 MG chewable tablet Chew 1 tablet (81 mg total) by mouth daily. 11/26/16   Gladstone Lighter, MD  atorvastatin (LIPITOR) 40 MG tablet Take 1 tablet (40 mg total) by mouth daily at 6 PM. 11/26/16   Gladstone Lighter, MD  carvedilol (COREG) 12.5 MG tablet Take 1 tablet (12.5 mg total) by mouth 2 (two) times daily with a meal. 11/26/16   Gladstone Lighter, MD  ciprofloxacin (CIPRO) 500 MG tablet Take 1 tablet (500 mg total) by mouth 2 (two) times daily. 12/18/17   Carrie Mew, MD  cyclobenzaprine (FLEXERIL) 10 MG tablet Take 1 tablet (10 mg  total) by mouth 3 (three) times daily as needed for muscle spasms. Patient not taking: Reported on 12/18/2017 12/29/16   Little, Traci M, PA-C  esomeprazole (NEXIUM) 40 MG capsule Take 40 mg 2 (two) times daily before a meal by mouth.    [provider]  gabapentin (NEURONTIN) 300 MG capsule Take 900 mg by mouth at bedtime.     [provider]  lisinopril (PRINIVIL,ZESTRIL) 20 MG tablet Take 1 tablet (20 mg total) by mouth daily. 11/26/16   Gladstone Lighter, MD  metroNIDAZOLE (FLAGYL) 500 MG tablet Take 1 tablet (500 mg total) by mouth 3 (three) times daily. 12/18/17   Carrie Mew, MD  naproxen (NAPROSYN) 500 MG tablet Take 1 tablet (500 mg total) by mouth 2 (two) times daily with a meal. 12/18/17   Carrie Mew, MD  omeprazole (PRILOSEC) 20 MG capsule Take 20 mg by mouth daily.    [provider]  ondansetron (ZOFRAN ODT) 4 MG disintegrating tablet Take 1 tablet (4 mg total) by mouth every 8 (eight) hours as needed for nausea or vomiting. 12/18/17   Carrie Mew, MD      Allergies    Patient has no known allergies.    Review of Systems   Review of Systems  Musculoskeletal:  Positive for neck pain and neck stiffness.  Neurological:  Positive for numbness.  All  other systems reviewed and are negative.  Physical Exam Updated Vital Signs BP (!) 112/97   Pulse 63   Temp 97.9 F (36.6 C) (Oral)   Resp 17   Ht 5\' 7"  (1.702 m)   Wt 83.9 kg   SpO2 98%   BMI 28.98 kg/m  Physical Exam Vitals and nursing note reviewed.  Constitutional:      General: He is not in acute distress.    Appearance: Normal appearance. He is well-developed. He is not ill-appearing, toxic-appearing or diaphoretic.  HENT:     Head: Normocephalic and atraumatic.     Nose: No nasal deformity.     Mouth/Throat:     Lips: Pink. No lesions.  Eyes:     General: Gaze aligned appropriately. No scleral icterus.       Right eye: No discharge.        Left eye: No discharge.      Conjunctiva/sclera: Conjunctivae normal.     Right eye: Right conjunctiva is not injected. No exudate or hemorrhage.    Left eye: Left conjunctiva is not injected. No exudate or hemorrhage. Pulmonary:     Effort: Pulmonary effort is normal. No respiratory distress.  Musculoskeletal:     Comments: There is midline cervical spine tenderness to palpation with no obvious step-offs noted.  Patient barely able to extend his neck.  He has 2+ radial pulses bilaterally.  Sensation seems to be equal bilaterally.  Grip strength is more weak on the left upper extremity.  Skin:    General: Skin is warm and dry.  Neurological:     Mental Status: He is alert and oriented to person, place, and time.  Psychiatric:        Mood and Affect: Mood normal.        Speech: Speech normal.        Behavior: Behavior normal. Behavior is cooperative.    ED Results / Procedures / Treatments   Labs (all labs ordered are listed, but only abnormal results are displayed) Labs Reviewed  BASIC METABOLIC PANEL - Abnormal; Notable for the following components:      Result Value   Glucose, Bld 105 (*)    All other components within normal limits  CBC WITH DIFFERENTIAL/PLATELET  CBC WITH DIFFERENTIAL/PLATELET    EKG None  Radiology CT Angio Neck W and/or Wo Contrast  Result Date: 10/03/2021 CLINICAL DATA:  Ataxia, cervical trauma EXAM: CT ANGIOGRAPHY NECK TECHNIQUE: Multidetector CT imaging of the neck was performed using the standard protocol during bolus administration of intravenous contrast. Multiplanar CT image reconstructions and MIPs were obtained to evaluate the vascular anatomy. Carotid stenosis measurements (when applicable) are obtained utilizing NASCET criteria, using the distal internal carotid diameter as the denominator. RADIATION DOSE REDUCTION: This exam was performed according to the departmental dose-optimization program which includes automated exposure control, adjustment of the mA and/or kV according  to patient size and/or use of iterative reconstruction technique. CONTRAST:  163mL OMNIPAQUE IOHEXOL 350 MG/ML SOLN COMPARISON:  None Available. FINDINGS: Aortic arch: Atherosclerosis of the aorta and great vessel origins. Great vessel origins are patent without significant stenosis. Right carotid system: No evidence of dissection, stenosis (50% or greater) or occlusion. Atherosclerosis at the carotid bifurcation without greater than 50% stenosis relative to the distal vessel. Left carotid system: No evidence of dissection, stenosis (50% or greater) or occlusion. Mild atherosclerosis at the carotid bifurcation without greater than 50% stenosis. Vertebral arteries: Codominant. No evidence of dissection, stenosis (50% or greater) or occlusion.  Mild atherosclerotic narrowing of the right vertebral artery origin. Skeleton: See same day/concurrent CT of the cervical spine for evaluation of the cervical spine. Other neck: No acute findings. Upper chest: Emphysema. Approximately 6 mm pulmonary nodule in the right upper lobe (see series 7, image 239). IMPRESSION: 1. No evidence of acute arterial injury. 2. Bilateral carotid bifurcation atherosclerosis without greater than 50% stenosis. 3. Approximately 6 mm pulmonary nodule in the right upper lobe. Non-contrast chest CT at 6-12 months is recommended. If the nodule is stable at time of repeat CT, then future CT at 18-24 months (from today's scan) is considered optional for low-risk patients, but is recommended for high-risk patients. This recommendation follows the consensus statement: Guidelines for Management of Incidental Pulmonary Nodules Detected on CT Images: From the Fleischner Society 2017; Radiology 2017; 284:228-243. 4. Aortic Atherosclerosis (ICD10-I70.0) and Emphysema (ICD10-J43.9). Electronically Signed   By: Margaretha Sheffield M.D.   On: 10/03/2021 14:34   CT Cervical Spine Wo Contrast  Result Date: 10/03/2021 CLINICAL DATA:  Neck trauma EXAM: CT CERVICAL  SPINE WITHOUT CONTRAST TECHNIQUE: Multidetector CT imaging of the cervical spine was performed without intravenous contrast. Multiplanar CT image reconstructions were also generated. RADIATION DOSE REDUCTION: This exam was performed according to the departmental dose-optimization program which includes automated exposure control, adjustment of the mA and/or kV according to patient size and/or use of iterative reconstruction technique. COMPARISON:  None Available. FINDINGS: Alignment: Normal. Skull base and vertebrae: No acute fracture. No primary bone lesion or focal pathologic process. Soft tissues and spinal canal: No prevertebral fluid or swelling. No visible canal hematoma. Disc levels:  Multilevel moderate degenerative disc disease. Upper chest: Centrilobular and paraseptal emphysema. Pulmonary nodule of the right lung apex measuring 4 mm on series 3, image 111. Other: None. IMPRESSION: 1. No evidence of acute cervical spine fracture or traumatic malalignment. 2. nodule of the right lung apex measuring 4 mm. No follow-up needed if patient is low-risk.This recommendation follows the consensus statement: Guidelines for Management of Incidental Pulmonary Nodules Detected on CT Images: From the Fleischner Society 2017; Radiology 2017; 284:228-243. Electronically Signed   By: Yetta Glassman M.D.   On: 10/03/2021 14:22    Procedures Procedures    Medications Ordered in ED Medications  morphine (PF) 4 MG/ML injection 4 mg (4 mg Intravenous Given 10/03/21 1249)  methocarbamol (ROBAXIN) tablet 500 mg (500 mg Oral Given 10/03/21 1210)  iohexol (OMNIPAQUE) 350 MG/ML injection 100 mL (100 mLs Intravenous Contrast Given 10/03/21 1352)    ED Course/ Medical Decision Making/ A&P Clinical Course as of 10/03/21 1547  Fri Oct 03, 2021  1455 I discussed obtaining MRI with patient to r/o spinal cord compression. He declines saying that he does not have time for this right now as he has an appointment for an  apartment today. He has not had any further symptoms of numbness in his right upper extremity and does not  [GL]  1542 CT Angio Neck W and/or Wo Contrast [GL]    Clinical Course User Index [GL] Ryn Peine, Adora Fridge, PA-C                           Medical Decision Making Amount and/or Complexity of Data Reviewed Labs: ordered. Radiology: ordered.  Risk Prescription drug management.    MDM  This is a 61 y.o. male who presents to the ED with neck pain The differential of this patient includes but is not limited to cervical artery dissection,  vertebral fracture, cord compression, epidural hematoma, etc.   My Impression, Plan, and ED Course: Patient appears to be uncomfortable with significant C-spine midline tenderness.  He also has mild focal weakness of the left upper extremity with normal subjective sensation on exam.  He does report having intermittent numbness of his left upper extremity though.  This is all in the setting of a recent hyperflexion/hyperextension whiplash injury that occurred 3 days ago.  He does have equal radial pulses bilaterally, however will get CT imaging of the cervical spine, as well as CTA imaging of the neck to rule out carotid artery dissection.  Will consider MRI and today after pain medication and C-spine imaging.   I personally ordered, reviewed, and interpreted all laboratory work and imaging and agree with radiologist interpretation. Results interpreted below:  CBC reassuring.  BMP reassuring. CT Cervical spine reveals no evidence of acute cervical spine fracture or traumatic malalignment.  There is incidental 4 mm right lung nodule with no follow-up recommendations. CTA neck with no evidence of acute arterial injury.  There is less than 50% stenosis of bilateral carotid along with 6 mm pulmonary nodule in the right upper lobe with recommendations for noncontrast CT follow-up at 6 to 12 months.  Discussed results with the patient.  His pain seems to be  improved and now rates it 4 out of 10.  He has better range of motion of his neck.  He has had no further left upper extremity paresthesias or numbness.  Did discuss possibility of getting an MRI to evaluate for spinal cord compression did not rule this out on his imaging that he is already had.  Patient declines this at this time and states that he has to get to an appointment this afternoon and does not feel that he needs this at this time. I have informed him of the dangers of leaving if this is indeed a spinal cord issue. He expresses understanding.  He will return if he develops any further numbness in his left upper extremity. We discussed supportive measures that he can try at home. He also will restart his home cyclobenzaprine for symptom relief. I will provide him PCP and orthopedic follow up.   Charting Requirements Additional history is obtained from:  Independent historian External Records from outside source obtained and reviewed including: Reviewed prior labs. No pertinent labs or imaging Social Determinants of Health:   recently moved here and has no pcp here Pertinant PMH that complicates patient's illness: n/a  Patient Care Problems that were addressed during this visit: - neck pain: Acute illness with complication Medications given in ED: Robaxin, Morphine Reevaluation of the patient after these medicines showed that the patient improved I have reviewed home medications and made changes accordingly.  Disposition: discharge. Strict return precautions. F/u with PCP and ortho.   Final Clinical Impression(s) / ED Diagnoses Final diagnoses:  Neck pain    Rx / DC Orders ED Discharge Orders     None         Adolphus Birchwood, PA-C 10/03/21 1548    Wyvonnia Dusky, MD 10/04/21 1224

## 2021-10-03 NOTE — Discharge Instructions (Signed)
Please return here if you develop any further numbness or weakness in your upper extremities. You will likely need to pursue MRI imaging at this point.   Please schedule appointment for PCP and follow up with [facility] for symptoms.

## 2021-10-03 NOTE — ED Triage Notes (Addendum)
Patient reports a history of "compressed vertebrae in the neck." Patient states he backed his fork lift into a pole 2 days ago and "snapped" his neck. Patient states he is unable to move his neck. MAE without difficulty, but states some numbness of the left arm.

## 2021-10-07 ENCOUNTER — Telehealth: Payer: Self-pay

## 2021-10-07 NOTE — Telephone Encounter (Signed)
Transition Care Management Unsuccessful Follow-up Telephone Call  Date of discharge and from where:  10/03/2021 from [facility]  Attempts:  1st Attempt  Reason for unsuccessful TCM follow-up call:  Left voice message

## 2021-10-08 NOTE — Telephone Encounter (Signed)
Transition Care Management Unsuccessful Follow-up Telephone Call  Date of discharge and from where:  10/03/2021 from [facility]  Attempts:  2nd Attempt  Reason for unsuccessful TCM follow-up call:  Left voice message    

## 2021-10-09 NOTE — Telephone Encounter (Signed)
Transition Care Management Unsuccessful Follow-up Telephone Call  Date of discharge and from where:  10/03/2021-[facility]  Attempts:  3rd Attempt  Reason for unsuccessful TCM follow-up call:  Left voice message    

## 2023-11-07 IMAGING — CT CT CERVICAL SPINE W/O CM
3 of 4 series · 12 of 35 positions shown, 14 images · non-contrast
Comparison: None Available.

CLINICAL DATA: Neck trauma



[Series 5: sagittal bone · sagittal · 0.44mm/px · 5 of 235 slices shown, 6 images]
[im 79/235  bone]
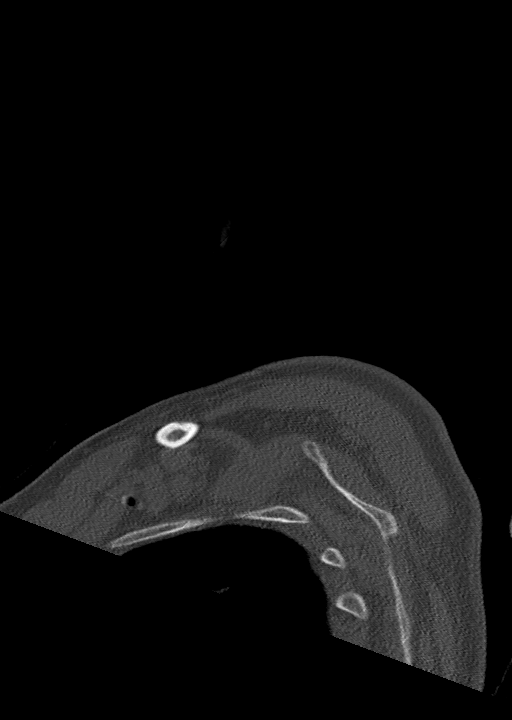
[im 98/235  bone]
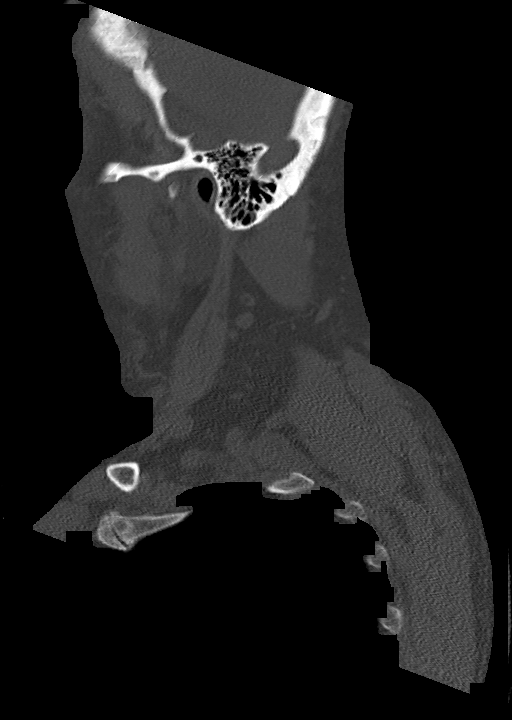
[im 118/235  soft-tissue]
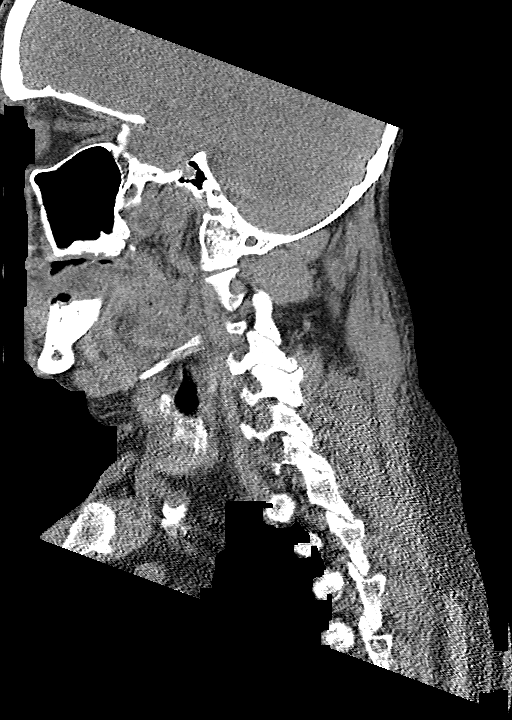
[im 118/235  bone]
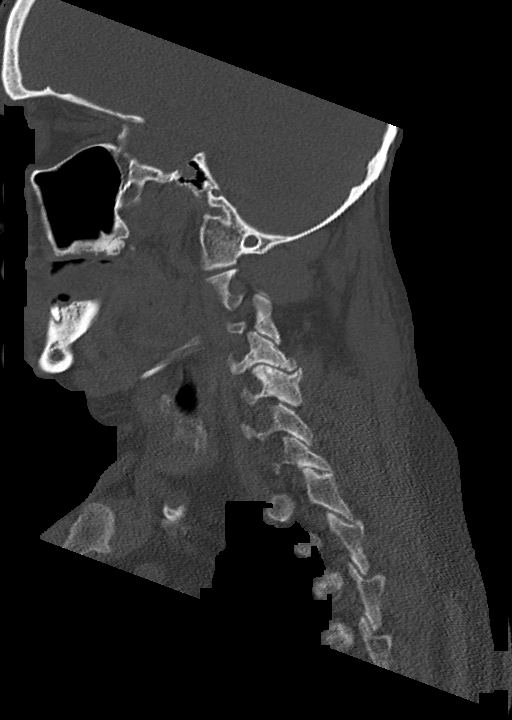
[im 137/235  bone]
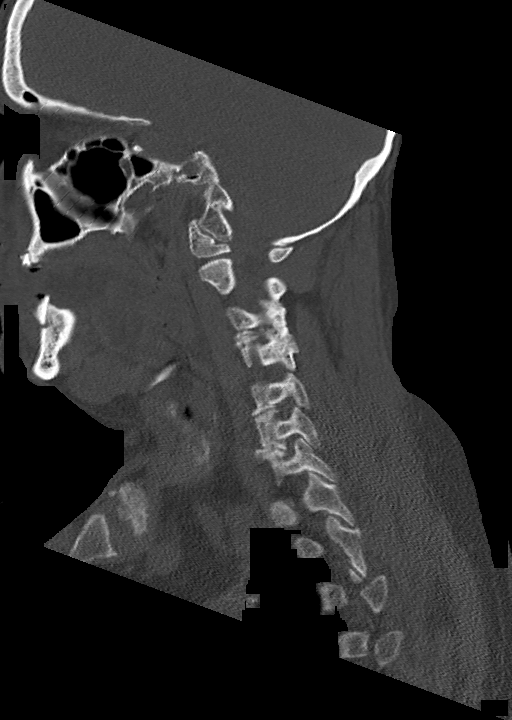
[im 157/235  bone]
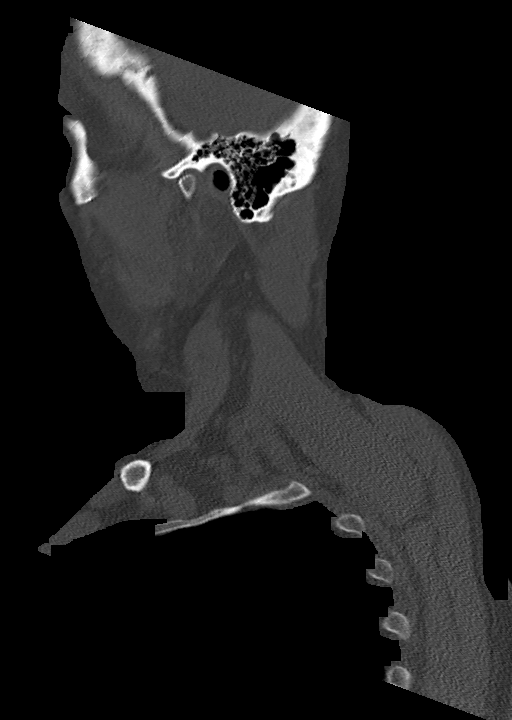

[Series 6: coronal bone · coronal · 0.49mm/px · 3 of 158 slices shown]
[im 32/158  bone]
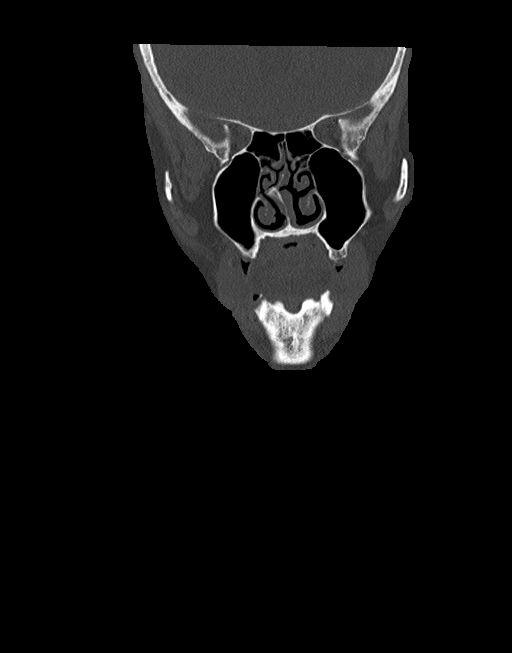
[im 63/158  bone]
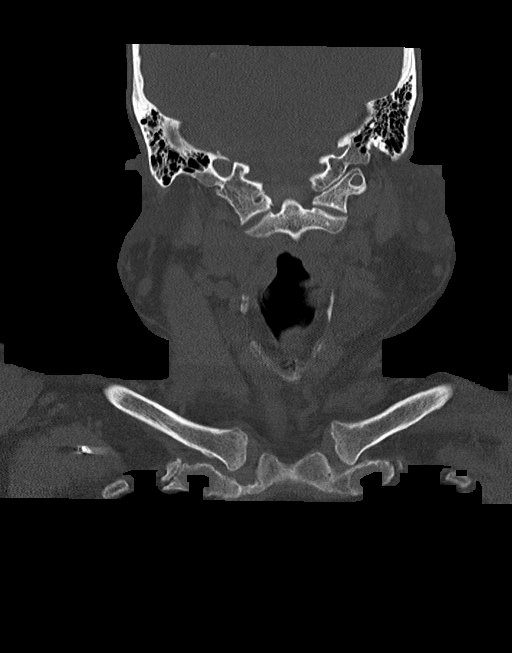
[im 95/158  bone]
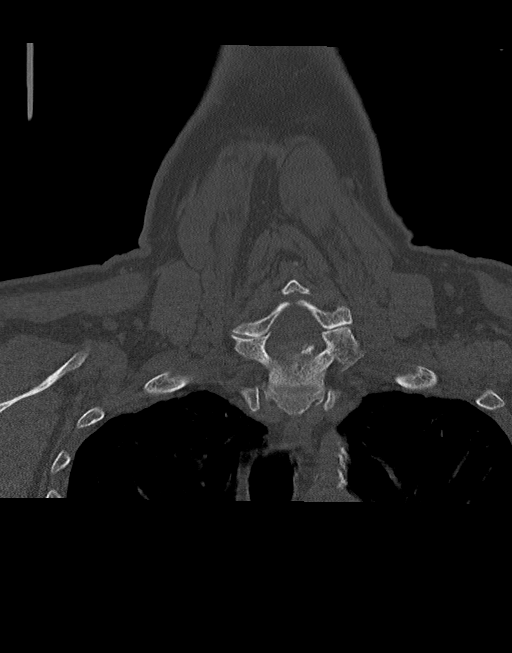

[Series 7: orthogonal axials · axial · 0.58mm/px · z∈[+1144,+1332]mm · 4 of 139 slices shown, 5 images]
[im 20/139  soft-tissue]
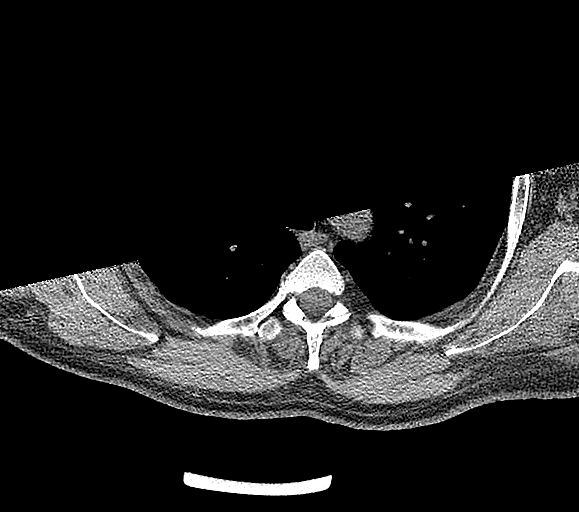
[im 20/139  bone]
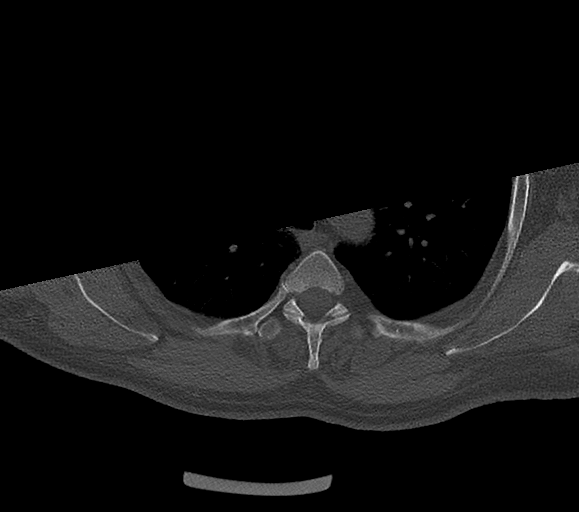
[im 60/139  bone]
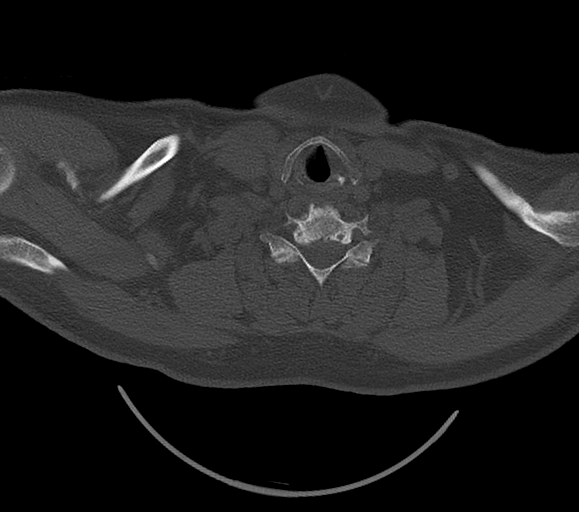
[im 79/139  bone]
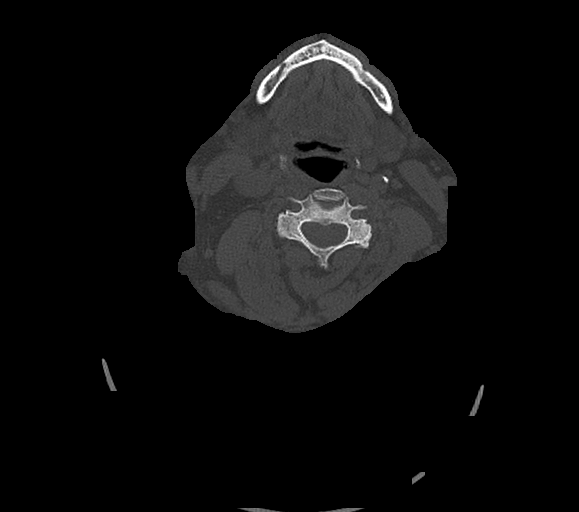
[im 119/139  bone]
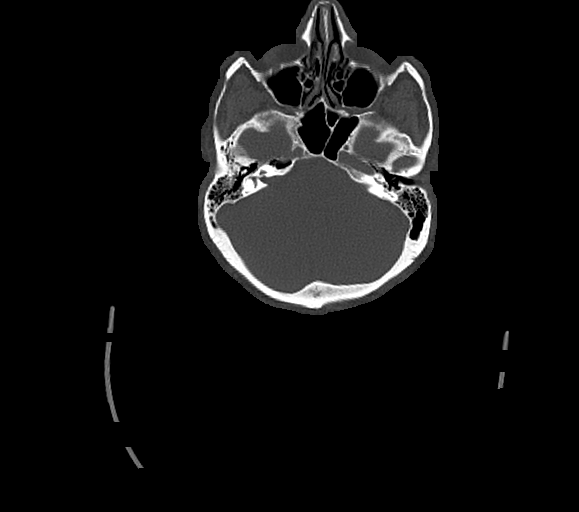

[12 of 35 positions shown; findings below may reference images not displayed]

FINDINGS: Alignment: Normal.

Skull base and vertebrae: No acute fracture. No primary bone lesion
or focal pathologic process.

Soft tissues and spinal canal: No prevertebral fluid or swelling. No
visible canal hematoma.

Disc levels:  Multilevel moderate degenerative disc disease.

Upper chest: Centrilobular and paraseptal emphysema. Pulmonary
nodule of the right lung apex measuring 4 mm on series 3, image 111.

Other: None.
IMPRESSION: 1. No evidence of acute cervical spine fracture or traumatic
malalignment.
2. nodule of the right lung apex measuring 4 mm. No follow-up needed
if patient is low-risk.This recommendation follows the consensus
statement: Guidelines for Management of Incidental Pulmonary Nodules
Detected on CT Images: From the [HOSPITAL] 7480; Radiology

## 2023-11-07 IMAGING — CT CT ANGIO NECK
3 of 7 series · 6 of 16 positions shown · IV contrast (APPLIED)
Comparison: None Available.

CLINICAL DATA: Ataxia, cervical trauma



[Series 7: ax thin · axial · 0.68mm/px · z∈[+1129,+1223]mm · 2 of 315 slices shown]
[im 105/315  soft-tissue]
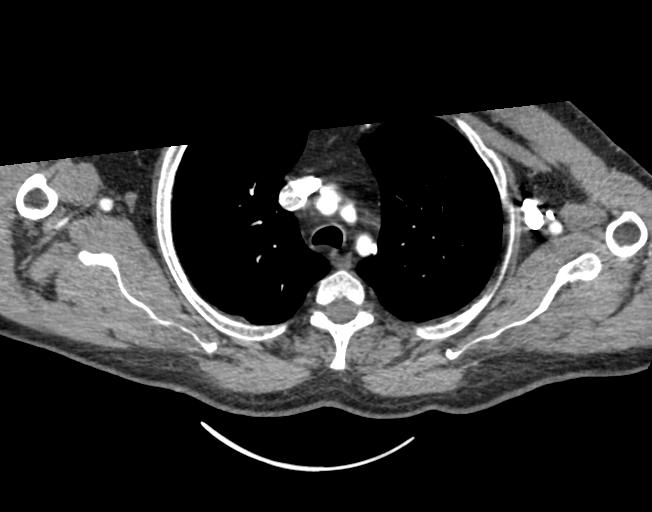
[im 210/315  bone]
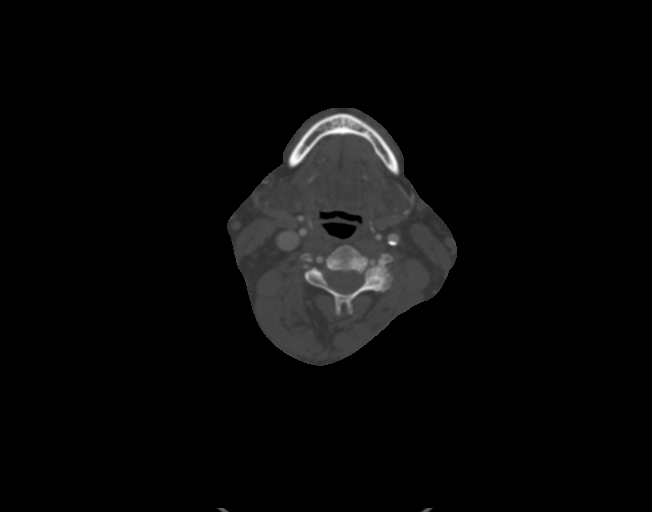

[Series 8: coronal thin · coronal · 0.58mm/px · 2 of 311 slices shown]
[im 104/311  soft-tissue]
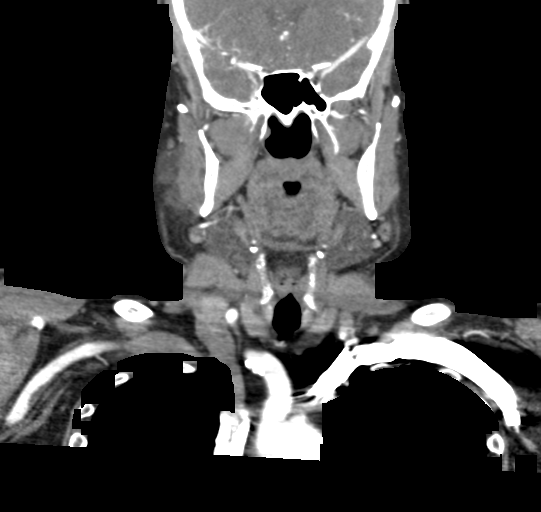
[im 207/311  soft-tissue]
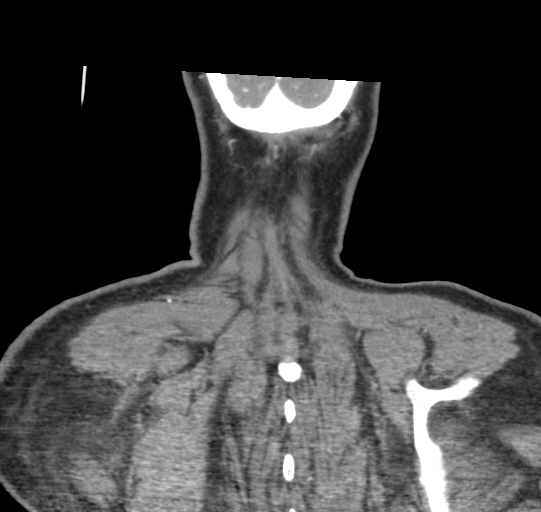

[Series 9: sagittal thin · sagittal · 0.43mm/px · 2 of 298 slices shown]
[im 100/298  soft-tissue]
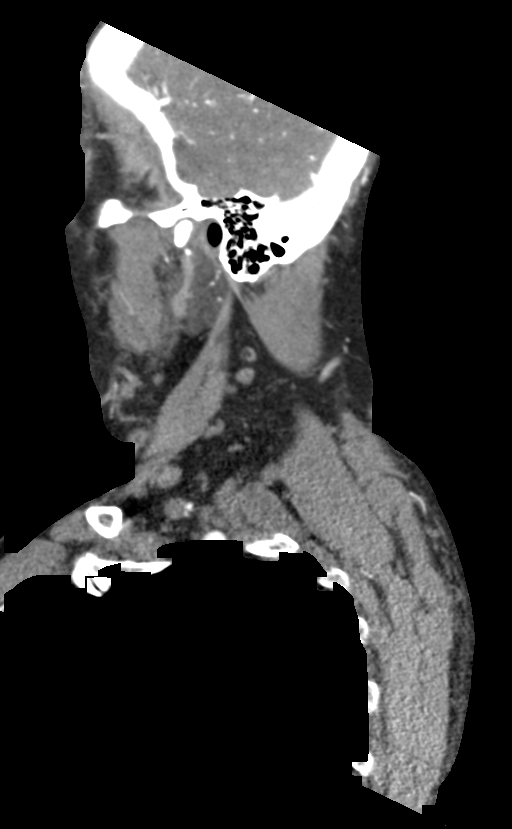
[im 199/298  soft-tissue]
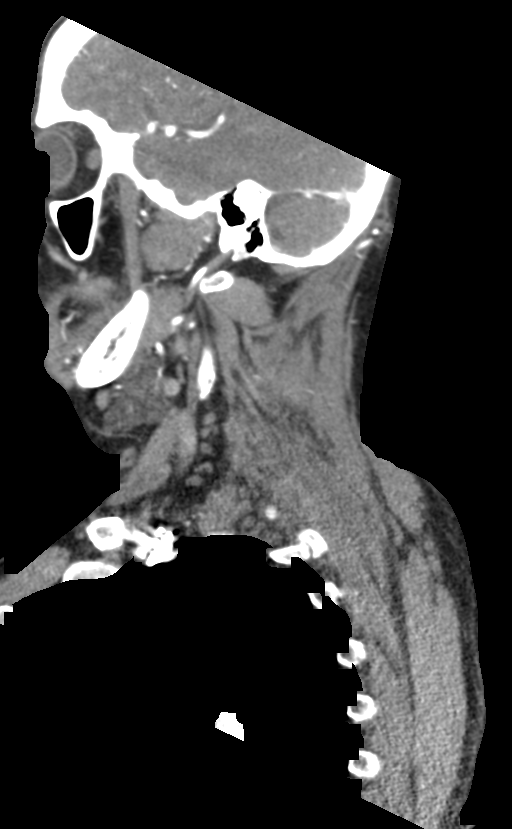

[6 of 16 positions shown; findings below may reference images not displayed]

RADIATION DOSE REDUCTION: This exam was performed according to the
departmental dose-optimization program which includes automated
exposure control, adjustment of the mA and/or kV according to
patient size and/or use of iterative reconstruction technique.

CONTRAST:  100mL OMNIPAQUE IOHEXOL 350 MG/ML SOLN
FINDINGS: Aortic arch: Atherosclerosis of the aorta and great vessel origins.
Great vessel origins are patent without significant stenosis.

Right carotid system: No evidence of dissection, stenosis (50% or
greater) or occlusion. Atherosclerosis at the carotid bifurcation
without greater than 50% stenosis relative to the distal vessel.

Left carotid system: No evidence of dissection, stenosis (50% or
greater) or occlusion. Mild atherosclerosis at the carotid
bifurcation without greater than 50% stenosis.

Vertebral arteries: Codominant. No evidence of dissection, stenosis
(50% or greater) or occlusion. Mild atherosclerotic narrowing of the
right vertebral artery origin.

Skeleton: See same day/concurrent CT of the cervical spine for
evaluation of the cervical spine.

Other neck: No acute findings.

Upper chest: Emphysema. Approximately 6 mm pulmonary nodule in the
right upper lobe (see series 7, image 239).
IMPRESSION: 1. No evidence of acute arterial injury.
2. Bilateral carotid bifurcation atherosclerosis without greater
than 50% stenosis.
3. Approximately 6 mm pulmonary nodule in the right upper lobe.
Non-contrast chest CT at 6-12 months is recommended. If the nodule
is stable at time of repeat CT, then future CT at 18-24 months (from
today's scan) is considered optional for low-risk patients, but is
recommended for high-risk patients. This recommendation follows the
consensus statement: Guidelines for Management of Incidental
Pulmonary Nodules Detected on CT Images: From the [HOSPITAL]
4. Aortic Atherosclerosis (FWRIW-0IK.K) and Emphysema (FWRIW-VJ8.H).
